# Patient Record
Sex: Female | Born: 1950 | Race: White | Hispanic: No | Marital: Married | Smoking: Former smoker
Health system: Southern US, Community
[De-identification: ages and names within clinical notes are randomized; demographics above are authoritative.]

## PROBLEM LIST (undated history)

## (undated) DIAGNOSIS — E039 Hypothyroidism, unspecified: Secondary | ICD-10-CM

## (undated) DIAGNOSIS — N390 Urinary tract infection, site not specified: Secondary | ICD-10-CM

## (undated) DIAGNOSIS — M1711 Unilateral primary osteoarthritis, right knee: Secondary | ICD-10-CM

## (undated) DIAGNOSIS — K219 Gastro-esophageal reflux disease without esophagitis: Secondary | ICD-10-CM

## (undated) DIAGNOSIS — R198 Other specified symptoms and signs involving the digestive system and abdomen: Secondary | ICD-10-CM

## (undated) DIAGNOSIS — R1310 Dysphagia, unspecified: Secondary | ICD-10-CM

## (undated) DIAGNOSIS — T4145XA Adverse effect of unspecified anesthetic, initial encounter: Secondary | ICD-10-CM

## (undated) DIAGNOSIS — M1990 Unspecified osteoarthritis, unspecified site: Secondary | ICD-10-CM

## (undated) DIAGNOSIS — S92302K Fracture of unspecified metatarsal bone(s), left foot, subsequent encounter for fracture with nonunion: Secondary | ICD-10-CM

## (undated) DIAGNOSIS — Z972 Presence of dental prosthetic device (complete) (partial): Secondary | ICD-10-CM

## (undated) DIAGNOSIS — K08109 Complete loss of teeth, unspecified cause, unspecified class: Secondary | ICD-10-CM

## (undated) DIAGNOSIS — E7800 Pure hypercholesterolemia, unspecified: Secondary | ICD-10-CM

## (undated) DIAGNOSIS — Z8719 Personal history of other diseases of the digestive system: Secondary | ICD-10-CM

## (undated) DIAGNOSIS — T8859XA Other complications of anesthesia, initial encounter: Secondary | ICD-10-CM

## (undated) HISTORY — PX: IR ESOPHAGUS DILITATION RETRO FLUORO: IMG5574

## (undated) HISTORY — PX: ABDOMINAL HYSTERECTOMY: SHX81

## (undated) HISTORY — PX: APPENDECTOMY: SHX54

---

## 2003-12-09 HISTORY — PX: CHOLECYSTECTOMY: SHX55

## 2013-05-27 ENCOUNTER — Encounter (HOSPITAL_BASED_OUTPATIENT_CLINIC_OR_DEPARTMENT_OTHER): Payer: Self-pay | Admitting: *Deleted

## 2013-05-27 ENCOUNTER — Emergency Department (HOSPITAL_BASED_OUTPATIENT_CLINIC_OR_DEPARTMENT_OTHER): Payer: Worker's Compensation

## 2013-05-27 ENCOUNTER — Emergency Department (HOSPITAL_BASED_OUTPATIENT_CLINIC_OR_DEPARTMENT_OTHER)
Admission: EM | Admit: 2013-05-27 | Discharge: 2013-05-27 | Disposition: A | Discharge status: Home / Self Care | Payer: Worker's Compensation | Attending: Emergency Medicine | Admitting: Emergency Medicine

## 2013-05-27 DIAGNOSIS — X500XXA Overexertion from strenuous movement or load, initial encounter: Secondary | ICD-10-CM | POA: Insufficient documentation

## 2013-05-27 DIAGNOSIS — S92355A Nondisplaced fracture of fifth metatarsal bone, left foot, initial encounter for closed fracture: Secondary | ICD-10-CM

## 2013-05-27 DIAGNOSIS — E7800 Pure hypercholesterolemia, unspecified: Secondary | ICD-10-CM | POA: Insufficient documentation

## 2013-05-27 DIAGNOSIS — E079 Disorder of thyroid, unspecified: Secondary | ICD-10-CM | POA: Insufficient documentation

## 2013-05-27 DIAGNOSIS — Y9389 Activity, other specified: Secondary | ICD-10-CM | POA: Insufficient documentation

## 2013-05-27 DIAGNOSIS — Y9289 Other specified places as the place of occurrence of the external cause: Secondary | ICD-10-CM | POA: Insufficient documentation

## 2013-05-27 DIAGNOSIS — S92309A Fracture of unspecified metatarsal bone(s), unspecified foot, initial encounter for closed fracture: Secondary | ICD-10-CM | POA: Insufficient documentation

## 2013-05-27 DIAGNOSIS — Z79899 Other long term (current) drug therapy: Secondary | ICD-10-CM | POA: Insufficient documentation

## 2013-05-27 HISTORY — DX: Pure hypercholesterolemia, unspecified: E78.00

## 2013-05-27 MED ORDER — HYDROCODONE-ACETAMINOPHEN 5-325 MG PO TABS: 2.0000 | ORAL_TABLET | ORAL | Status: DC | PRN

## 2013-05-27 MED ORDER — HYDROCODONE-ACETAMINOPHEN 5-325 MG PO TABS
1.0000 | ORAL_TABLET | Freq: Once | ORAL | Status: DC
  Filled 2013-05-27: qty 1

## 2013-05-27 NOTE — ED Notes (Signed)
Twisted her left foot. Abrasion to her right knee.

## 2013-05-27 NOTE — ED Provider Notes (Signed)
History     CSN: [id number]  Arrival date & time 05/27/13  1417   First MD Initiated Contact with Patient 05/27/13 1528      Chief Complaint  Patient presents with  . Foot Injury    (Consider location/radiation/quality/duration/timing/severity/associated sxs/prior treatment) Patient is a 62 y.o. female presenting with foot injury. The history is provided by the patient. No language interpreter was used.  Foot Injury Location:  Foot Injury: yes   Foot location:  L foot Pain details:    Quality:  Aching and burning   Severity:  Moderate   Duration:  2 hours   Timing:  Constant   Progression:  Worsening Chronicity:  New Worsened by:  Nothing tried Pt reports she turned her foot over.   Pt complains of swelling and pain  Past Medical History  Diagnosis Date  . Thyroid disease   . High cholesterol     Past Surgical History  Procedure Laterality Date  . Cholecystectomy    . Abdominal hysterectomy      No family history on file.  History  Substance Use Topics  . Smoking status: Never Smoker   . Smokeless tobacco: Not on file  . Alcohol Use: No    OB History   Grav Para Term Preterm Abortions TAB SAB Ect Mult Living                  Review of Systems  Musculoskeletal: Positive for myalgias and joint swelling.  All other systems reviewed and are negative.    Allergies  Darvocet  Home Medications   Current Outpatient Rx  Name  Route  Sig  Dispense  Refill  . levothyroxine (SYNTHROID) 75 MCG tablet   Oral   Take 75 mcg by mouth daily before breakfast.         . simvastatin (ZOCOR) 10 MG tablet   Oral   Take 10 mg by mouth at bedtime.           BP 140/64  Pulse 73  Temp(Src) 98.4 F (36.9 C) (Oral)  Resp 20  Wt 200 lb (90.719 kg)  SpO2 93%  Physical Exam  Nursing note and vitals reviewed. Constitutional: She is oriented to person, place, and time. She appears well-developed and well-nourished.  Musculoskeletal: She exhibits edema and  tenderness.  Swollen tender lateral left foot  Decreased range of motion  Neurological: She is alert and oriented to person, place, and time. She has normal reflexes.  Skin: Skin is warm.  Psychiatric: She has a normal mood and affect.    ED Course  Procedures (including critical care time)  Labs Reviewed - No data to display Dg Foot Complete Left  05/27/2013   *RADIOLOGY REPORT*  Clinical Data: And lateral pain.  Injury.  LEFT FOOT - COMPLETE 3+ VIEW  Comparison: None  Findings: There is a fracture through the base of the left fifth metatarsal.  Minimal displacement.  Slight overlying soft tissue swelling.  No additional acute bony abnormality.  Mild hallux valgus.  IMPRESSION: Minimally-displaced fracture through the base of the left fifth metatarsal.   Original Report Authenticated By: Charlett Nose, M.D.     1. Closed nondisplaced fracture of fifth left metatarsal bone, initial encounter       MDM  Cam walker, crutches,  Hydrococone,   Follow up with Dr. Lajoyce Corners for evaluation next week        Elson Areas, PA-C 05/27/13 1603  Lonia Skinner [place], PA-C 05/27/13 1605

## 2013-05-27 NOTE — ED Provider Notes (Signed)
Medical screening examination/treatment/procedure(s) were performed by non-physician practitioner and as supervising physician I was immediately available for consultation/collaboration.   Susy Placzek, MD 05/27/13 1847 

## 2013-05-27 NOTE — ED Notes (Signed)
Lauren, EMT at bedside for cam walker and crutches, will monitor.

## 2013-06-08 ENCOUNTER — Ambulatory Visit (INDEPENDENT_AMBULATORY_CARE_PROVIDER_SITE_OTHER): Payer: Worker's Compensation

## 2013-06-08 ENCOUNTER — Other Ambulatory Visit: Payer: Self-pay | Admitting: Sports Medicine

## 2013-06-08 DIAGNOSIS — IMO0001 Reserved for inherently not codable concepts without codable children: Secondary | ICD-10-CM

## 2013-06-08 DIAGNOSIS — M79672 Pain in left foot: Secondary | ICD-10-CM

## 2014-02-05 DIAGNOSIS — S92302K Fracture of unspecified metatarsal bone(s), left foot, subsequent encounter for fracture with nonunion: Secondary | ICD-10-CM

## 2014-02-05 HISTORY — DX: Fracture of unspecified metatarsal bone(s), left foot, subsequent encounter for fracture with nonunion: S92.302K

## 2014-02-17 ENCOUNTER — Encounter (HOSPITAL_BASED_OUTPATIENT_CLINIC_OR_DEPARTMENT_OTHER): Payer: Self-pay | Admitting: *Deleted

## 2014-02-22 ENCOUNTER — Other Ambulatory Visit: Payer: Self-pay | Admitting: Orthopedic Surgery

## 2014-02-23 ENCOUNTER — Encounter (HOSPITAL_BASED_OUTPATIENT_CLINIC_OR_DEPARTMENT_OTHER)
Admission: RE | Disposition: A | Discharge status: Home / Self Care | Payer: Self-pay | Source: Ambulatory Visit | Attending: Orthopedic Surgery

## 2014-02-23 ENCOUNTER — Ambulatory Visit (HOSPITAL_BASED_OUTPATIENT_CLINIC_OR_DEPARTMENT_OTHER): Payer: Worker's Compensation | Admitting: Certified Registered"

## 2014-02-23 ENCOUNTER — Ambulatory Visit (HOSPITAL_BASED_OUTPATIENT_CLINIC_OR_DEPARTMENT_OTHER)
Admission: RE | Admit: 2014-02-23 | Discharge: 2014-02-23 | Disposition: A | Discharge status: Home / Self Care | Payer: Worker's Compensation | Source: Ambulatory Visit | Attending: Orthopedic Surgery | Admitting: Orthopedic Surgery

## 2014-02-23 ENCOUNTER — Encounter (HOSPITAL_BASED_OUTPATIENT_CLINIC_OR_DEPARTMENT_OTHER): Payer: Worker's Compensation | Admitting: Certified Registered"

## 2014-02-23 ENCOUNTER — Encounter (HOSPITAL_BASED_OUTPATIENT_CLINIC_OR_DEPARTMENT_OTHER): Payer: Self-pay | Admitting: Certified Registered"

## 2014-02-23 DIAGNOSIS — S8290XS Unspecified fracture of unspecified lower leg, sequela: Secondary | ICD-10-CM | POA: Insufficient documentation

## 2014-02-23 DIAGNOSIS — X58XXXS Exposure to other specified factors, sequela: Secondary | ICD-10-CM | POA: Insufficient documentation

## 2014-02-23 DIAGNOSIS — IMO0002 Reserved for concepts with insufficient information to code with codable children: Secondary | ICD-10-CM | POA: Insufficient documentation

## 2014-02-23 DIAGNOSIS — E039 Hypothyroidism, unspecified: Secondary | ICD-10-CM | POA: Insufficient documentation

## 2014-02-23 DIAGNOSIS — Z87891 Personal history of nicotine dependence: Secondary | ICD-10-CM | POA: Insufficient documentation

## 2014-02-23 DIAGNOSIS — E7800 Pure hypercholesterolemia, unspecified: Secondary | ICD-10-CM | POA: Insufficient documentation

## 2014-02-23 HISTORY — DX: Hypothyroidism, unspecified: E03.9

## 2014-02-23 HISTORY — DX: Fracture of unspecified metatarsal bone(s), left foot, subsequent encounter for fracture with nonunion: S92.302K

## 2014-02-23 HISTORY — DX: Dysphagia, unspecified: R13.10

## 2014-02-23 HISTORY — DX: Adverse effect of unspecified anesthetic, initial encounter: T41.45XA

## 2014-02-23 HISTORY — DX: Other specified symptoms and signs involving the digestive system and abdomen: R19.8

## 2014-02-23 HISTORY — DX: Other complications of anesthesia, initial encounter: T88.59XA

## 2014-02-23 HISTORY — DX: Complete loss of teeth, unspecified cause, unspecified class: K08.109

## 2014-02-23 HISTORY — DX: Presence of dental prosthetic device (complete) (partial): Z97.2

## 2014-02-23 HISTORY — PX: ANKLE RECONSTRUCTION: SHX1151

## 2014-02-23 LAB — POCT HEMOGLOBIN-HEMACUE: HEMOGLOBIN: 15 g/dL (ref 12.0–15.0)

## 2014-02-23 SURGERY — RECONSTRUCTION, ANKLE
Anesthesia: General | Site: Foot | Laterality: Left

## 2014-02-23 MED ORDER — MIDAZOLAM HCL 2 MG/2ML IJ SOLN
INTRAMUSCULAR | Status: AC
  Filled 2014-02-23: qty 2

## 2014-02-23 MED ORDER — CEFAZOLIN SODIUM-DEXTROSE 2-3 GM-% IV SOLR
2.0000 g | INTRAVENOUS | Status: AC
  Administered 2014-02-23: 2 g via INTRAVENOUS

## 2014-02-23 MED ORDER — MIDAZOLAM HCL 2 MG/2ML IJ SOLN
1.0000 mg | INTRAMUSCULAR | Status: DC | PRN
  Administered 2014-02-23: 2 mg via INTRAVENOUS

## 2014-02-23 MED ORDER — PROPOFOL 10 MG/ML IV BOLUS
INTRAVENOUS | Status: DC | PRN
  Administered 2014-02-23: 150 mg via INTRAVENOUS

## 2014-02-23 MED ORDER — MIDAZOLAM HCL 2 MG/2ML IJ SOLN: 1.0000 mg | INTRAMUSCULAR | Status: DC | PRN

## 2014-02-23 MED ORDER — 0.9 % SODIUM CHLORIDE (POUR BTL) OPTIME
TOPICAL | Status: DC | PRN
  Administered 2014-02-23: 200 mL

## 2014-02-23 MED ORDER — DEXAMETHASONE SODIUM PHOSPHATE 10 MG/ML IJ SOLN
INTRAMUSCULAR | Status: DC | PRN
  Administered 2014-02-23: 10 mg via INTRAVENOUS

## 2014-02-23 MED ORDER — ASPIRIN EC 325 MG PO TBEC: 325.0000 mg | DELAYED_RELEASE_TABLET | Freq: Every day | ORAL | Status: DC

## 2014-02-23 MED ORDER — CEFAZOLIN SODIUM-DEXTROSE 2-3 GM-% IV SOLR
INTRAVENOUS | Status: AC
  Filled 2014-02-23: qty 50

## 2014-02-23 MED ORDER — BACITRACIN ZINC 500 UNIT/GM EX OINT
TOPICAL_OINTMENT | CUTANEOUS | Status: DC | PRN
  Administered 2014-02-23: 1 via TOPICAL

## 2014-02-23 MED ORDER — SODIUM CHLORIDE 0.9 % IV SOLN: INTRAVENOUS | Status: DC

## 2014-02-23 MED ORDER — FENTANYL CITRATE 0.05 MG/ML IJ SOLN: 50.0000 ug | INTRAMUSCULAR | Status: DC | PRN

## 2014-02-23 MED ORDER — DEXAMETHASONE SODIUM PHOSPHATE 10 MG/ML IJ SOLN
INTRAMUSCULAR | Status: DC | PRN
  Administered 2014-02-23: 4 mg

## 2014-02-23 MED ORDER — FENTANYL CITRATE 0.05 MG/ML IJ SOLN
50.0000 ug | Freq: Once | INTRAMUSCULAR | Status: AC
  Administered 2014-02-23: 100 ug via INTRAVENOUS

## 2014-02-23 MED ORDER — FENTANYL CITRATE 0.05 MG/ML IJ SOLN
INTRAMUSCULAR | Status: AC
Start: 2014-02-23 — End: 2014-02-23
  Filled 2014-02-23: qty 2

## 2014-02-23 MED ORDER — OXYCODONE HCL 5 MG PO TABS: 5.0000 mg | ORAL_TABLET | ORAL | Status: DC | PRN

## 2014-02-23 MED ORDER — CHLORHEXIDINE GLUCONATE 4 % EX LIQD: 60.0000 mL | Freq: Once | CUTANEOUS | Status: DC

## 2014-02-23 MED ORDER — BACITRACIN ZINC 500 UNIT/GM EX OINT
TOPICAL_OINTMENT | CUTANEOUS | Status: AC
  Filled 2014-02-23: qty 28.35

## 2014-02-23 MED ORDER — FENTANYL CITRATE 0.05 MG/ML IJ SOLN
INTRAMUSCULAR | Status: AC
  Filled 2014-02-23: qty 6

## 2014-02-23 MED ORDER — BUPIVACAINE-EPINEPHRINE PF 0.5-1:200000 % IJ SOLN
INTRAMUSCULAR | Status: AC
  Filled 2014-02-23: qty 30

## 2014-02-23 MED ORDER — ONDANSETRON HCL 4 MG/2ML IJ SOLN
INTRAMUSCULAR | Status: DC | PRN
  Administered 2014-02-23: 4 mg via INTRAVENOUS

## 2014-02-23 MED ORDER — BUPIVACAINE-EPINEPHRINE PF 0.5-1:200000 % IJ SOLN
INTRAMUSCULAR | Status: DC | PRN
  Administered 2014-02-23: 35 mL

## 2014-02-23 MED ORDER — LACTATED RINGERS IV SOLN
INTRAVENOUS | Status: DC
  Administered 2014-02-23 (×2): via INTRAVENOUS

## 2014-02-23 MED ORDER — MIDAZOLAM HCL 2 MG/ML PO SYRP
12.0000 mg | ORAL_SOLUTION | Freq: Once | ORAL | Status: DC | PRN
Start: 2014-02-23 — End: 2014-02-23

## 2014-02-23 MED ORDER — MIDAZOLAM HCL 5 MG/5ML IJ SOLN
INTRAMUSCULAR | Status: DC | PRN
  Administered 2014-02-23: 2 mg via INTRAVENOUS

## 2014-02-23 MED ORDER — LIDOCAINE HCL (CARDIAC) 20 MG/ML IV SOLN
INTRAVENOUS | Status: DC | PRN
  Administered 2014-02-23: 30 mg via INTRAVENOUS

## 2014-02-23 SURGICAL SUPPLY — 78 items
ANCHOR SUT 1.45 SZ 1 SHORT (Anchor) ×3 IMPLANT
BANDAGE ESMARK 6X9 LF (GAUZE/BANDAGES/DRESSINGS) ×1 IMPLANT
BLADE AVERAGE 25MMX9MM (BLADE) ×1
BLADE AVERAGE 25X9 (BLADE) ×2 IMPLANT
BLADE SURG 15 STRL LF DISP TIS (BLADE) ×2 IMPLANT
BLADE SURG 15 STRL SS (BLADE) ×4
BNDG COHESIVE 4X5 TAN STRL (GAUZE/BANDAGES/DRESSINGS) ×3 IMPLANT
BNDG COHESIVE 6X5 TAN STRL LF (GAUZE/BANDAGES/DRESSINGS) ×3 IMPLANT
BNDG ESMARK 4X9 LF (GAUZE/BANDAGES/DRESSINGS) IMPLANT
BNDG ESMARK 6X9 LF (GAUZE/BANDAGES/DRESSINGS) ×3
CHLORAPREP W/TINT 26ML (MISCELLANEOUS) ×3 IMPLANT
COVER TABLE BACK 60X90 (DRAPES) ×3 IMPLANT
CUFF TOURNIQUET SINGLE 18IN (TOURNIQUET CUFF) IMPLANT
CUFF TOURNIQUET SINGLE 34IN LL (TOURNIQUET CUFF) ×3 IMPLANT
DECANTER SPIKE VIAL GLASS SM (MISCELLANEOUS) IMPLANT
DRAPE C-ARM 42X72 X-RAY (DRAPES) IMPLANT
DRAPE C-ARMOR (DRAPES) IMPLANT
DRAPE EXTREMITY T 121X128X90 (DRAPE) ×3 IMPLANT
DRAPE OEC MINIVIEW 54X84 (DRAPES) IMPLANT
DRAPE U-SHAPE 47X51 STRL (DRAPES) IMPLANT
DRSG EMULSION OIL 3X3 NADH (GAUZE/BANDAGES/DRESSINGS) ×3 IMPLANT
DRSG PAD ABDOMINAL 8X10 ST (GAUZE/BANDAGES/DRESSINGS) ×9 IMPLANT
ELECT REM PT RETURN 9FT ADLT (ELECTROSURGICAL) ×3
ELECTRODE REM PT RTRN 9FT ADLT (ELECTROSURGICAL) ×1 IMPLANT
GLOVE BIO SURGEON STRL SZ8 (GLOVE) ×3 IMPLANT
GLOVE BIOGEL PI IND STRL 7.0 (GLOVE) ×1 IMPLANT
GLOVE BIOGEL PI IND STRL 7.5 (GLOVE) ×1 IMPLANT
GLOVE BIOGEL PI IND STRL 8 (GLOVE) ×1 IMPLANT
GLOVE BIOGEL PI INDICATOR 7.0 (GLOVE) ×2
GLOVE BIOGEL PI INDICATOR 7.5 (GLOVE) ×2
GLOVE BIOGEL PI INDICATOR 8 (GLOVE) ×2
GLOVE ECLIPSE 6.5 STRL STRAW (GLOVE) ×3 IMPLANT
GLOVE ECLIPSE 7.0 STRL STRAW (GLOVE) ×3 IMPLANT
GLOVE EXAM NITRILE MD LF STRL (GLOVE) IMPLANT
GOWN STRL REUS W/ TWL LRG LVL3 (GOWN DISPOSABLE) ×2 IMPLANT
GOWN STRL REUS W/ TWL XL LVL3 (GOWN DISPOSABLE) ×1 IMPLANT
GOWN STRL REUS W/TWL LRG LVL3 (GOWN DISPOSABLE) ×4
GOWN STRL REUS W/TWL XL LVL3 (GOWN DISPOSABLE) ×2
K-WIRE .062X4 (WIRE) IMPLANT
NEEDLE HYPO 22GX1.5 SAFETY (NEEDLE) IMPLANT
NS IRRIG 1000ML POUR BTL (IV SOLUTION) ×3 IMPLANT
PACK BASIN DAY SURGERY FS (CUSTOM PROCEDURE TRAY) ×3 IMPLANT
PAD CAST 4YDX4 CTTN HI CHSV (CAST SUPPLIES) ×1 IMPLANT
PADDING CAST ABS 4INX4YD NS (CAST SUPPLIES)
PADDING CAST ABS COTTON 4X4 ST (CAST SUPPLIES) IMPLANT
PADDING CAST COTTON 4X4 STRL (CAST SUPPLIES) ×2
PADDING CAST COTTON 6X4 STRL (CAST SUPPLIES) ×3 IMPLANT
PASSER SUT SWANSON 36MM LOOP (INSTRUMENTS) IMPLANT
PENCIL BUTTON HOLSTER BLD 10FT (ELECTRODE) ×3 IMPLANT
SANITIZER HAND PURELL 535ML FO (MISCELLANEOUS) ×3 IMPLANT
SHEET MEDIUM DRAPE 40X70 STRL (DRAPES) ×3 IMPLANT
SLEEVE SCD COMPRESS KNEE MED (MISCELLANEOUS) ×3 IMPLANT
SPLINT FAST PLASTER 5X30 (CAST SUPPLIES) ×40
SPLINT PLASTER CAST FAST 5X30 (CAST SUPPLIES) ×20 IMPLANT
SPONGE GAUZE 4X4 12PLY (GAUZE/BANDAGES/DRESSINGS) ×3 IMPLANT
SPONGE LAP 18X18 X RAY DECT (DISPOSABLE) ×3 IMPLANT
STOCKINETTE 6  STRL (DRAPES) ×2
STOCKINETTE 6 STRL (DRAPES) ×1 IMPLANT
SUCTION FRAZIER TIP 10 FR DISP (SUCTIONS) IMPLANT
SUT ETHIBOND 0 MO6 C/R (SUTURE) IMPLANT
SUT ETHIBOND 2 OS 4 DA (SUTURE) IMPLANT
SUT ETHILON 3 0 PS 1 (SUTURE) ×3 IMPLANT
SUT FIBERWIRE 2-0 18 17.9 3/8 (SUTURE)
SUT MERSILENE 2.0 SH NDLE (SUTURE) IMPLANT
SUT MNCRL AB 3-0 PS2 18 (SUTURE) ×3 IMPLANT
SUT MNCRL AB 4-0 PS2 18 (SUTURE) IMPLANT
SUT VIC AB 0 SH 27 (SUTURE) IMPLANT
SUT VIC AB 2-0 SH 18 (SUTURE) IMPLANT
SUT VIC AB 2-0 SH 27 (SUTURE)
SUT VIC AB 2-0 SH 27XBRD (SUTURE) IMPLANT
SUT VICRYL 4-0 PS2 18IN ABS (SUTURE) IMPLANT
SUTURE FIBERWR 2-0 18 17.9 3/8 (SUTURE) IMPLANT
SYR BULB 3OZ (MISCELLANEOUS) ×3 IMPLANT
TOWEL OR 17X24 6PK STRL BLUE (TOWEL DISPOSABLE) ×6 IMPLANT
TOWEL OR NON WOVEN STRL DISP B (DISPOSABLE) IMPLANT
TUBE CONNECTING 20'X1/4 (TUBING)
TUBE CONNECTING 20X1/4 (TUBING) IMPLANT
UNDERPAD 30X30 INCONTINENT (UNDERPADS AND DIAPERS) ×3 IMPLANT

## 2014-02-23 NOTE — Anesthesia Procedure Notes (Signed)
Anesthesia Regional Block:  Popliteal block  Pre-Anesthetic Checklist: ,, timeout performed, Correct Patient, Correct Site, Correct Laterality, Correct Procedure, Correct Position, site marked, Risks and benefits discussed,  Surgical consent,  Pre-op evaluation,  At surgeon's request and post-op pain management  Laterality: Left  Prep: chloraprep       Needles:  Injection technique: Single-shot  Needle Type: Echogenic Stimulator Needle     Needle Length: 9cm 9 cm Needle Gauge: 22 and 22 G    Additional Needles:  Procedures: nerve stimulator Popliteal block  Nerve Stimulator or Paresthesia:  Response: 0.5 mA,   Additional Responses:   Narrative:  Start time: 02/23/2014 9:20 AM End time: 02/23/2014 9:33 AM Injection made incrementally with aspirations every 5 mL. Anesthesiologist: Natalie Sanders  Additional Notes: 1610-9604: 0920-0933 L Popliteal Nerve Block POP CHG prep, sterile tech Lateral approach #22 stim/echo needle with stim down to .5ma Multiple neg asp Natalie Sanders .5% w/epi 1:200000 total 35cc+decadron 4mg  infiltrated No compl Natalie Sanders

## 2014-02-23 NOTE — Anesthesia Preprocedure Evaluation (Addendum)
Anesthesia Evaluation  Patient identified by MRN, date of birth, ID band Patient awake    Reviewed: Allergy & Precautions, H&P , NPO status , Patient's Chart, lab work & pertinent test results  Airway Mallampati: II TM Distance: >3 FB Neck ROM: Full    Dental   Pulmonary former smoker,  breath sounds clear to auscultation        Cardiovascular Rhythm:Regular Rate:Normal     Neuro/Psych    GI/Hepatic   Endo/Other  Hypothyroidism   Renal/GU      Musculoskeletal   Abdominal (+) + obese,   Peds  Hematology   Anesthesia Other Findings   Reproductive/Obstetrics                          Anesthesia Physical Anesthesia Plan  ASA: II  Anesthesia Plan: General   Post-op Pain Management:    Induction: Intravenous  Airway Management Planned: LMA  Additional Equipment:   Intra-op Plan:   Post-operative Plan: Extubation in OR  Informed Consent: I have reviewed the patients History and Physical, chart, labs and discussed the procedure including the risks, benefits and alternatives for the proposed anesthesia with the patient or authorized representative who has indicated his/her understanding and acceptance.     Plan Discussed with: CRNA and Surgeon  Anesthesia Plan Comments:         Anesthesia Quick Evaluation

## 2014-02-23 NOTE — Anesthesia Postprocedure Evaluation (Signed)
  Anesthesia Post-op Note  Patient: Natalie Sanders  Procedure(s) Performed: Procedure(s): LEFT FIFTH BASE EXOSTOSIS/PERONEUS BREVIS REPAIR (Left)  Patient Location: PACU  Anesthesia Type:GA combined with regional for post-op pain  Level of Consciousness: awake and alert   Airway and Oxygen Therapy: Patient Spontanous Breathing  Post-op Pain: none  Post-op Assessment: Post-op Vital signs reviewed, Patient's Cardiovascular Status Stable, Respiratory Function Stable, Patent Airway, No signs of Nausea or vomiting and Pain level controlled  Post-op Vital Signs: Reviewed and stable  Complications: No apparent anesthesia complications

## 2014-02-23 NOTE — Progress Notes (Signed)
Assisted Dr. Gypsy BalsamKasik with left, popliteal block. Side rails up, monitors on throughout procedure. See vital signs in flow sheet. Tolerated Procedure well.

## 2014-02-23 NOTE — Brief Op Note (Signed)
02/23/2014  11:28 AM  PATIENT:  Natalie Sanders  63 y.o. female  PRE-OPERATIVE DIAGNOSIS:  LEFT FIFTH METATARSAL BASE NON UNION  POST-OPERATIVE DIAGNOSIS:  LEFT FIFTH METATARSAL BASE NON UNION  Procedure(s): 1.  Excision of left 5th MT base nonunion 2.  Reconstruction of left peroneus brevis tendon  SURGEON:  Toni ArthursJohn Katalyn Matin, MD  ASSISTANT: n/a  ANESTHESIA:   General, regional  EBL:  minimal   TOURNIQUET:   Total Tourniquet Time Documented: Thigh (Left) - 23 minutes Total: Thigh (Left) - 23 minutes   COMPLICATIONS:  None apparent  DISPOSITION:  Extubated, awake and stable to recovery.  DICTATION ID:  [id number]

## 2014-02-23 NOTE — Op Note (Signed)
NAME:  Natalie Sanders, [name]              ACCOUNT NO.:  [id number]  MEDICAL RECORD NO.:  [id number]  LOCATION:                                 FACILITY:  PHYSICIAN:  Toni ArthursJohn Brady Schiller, MD             DATE OF BIRTH:  DATE OF PROCEDURE:  02/23/2014 DATE OF DISCHARGE:                              OPERATIVE REPORT   PREOPERATIVE DIAGNOSIS:  Left fifth metatarsal base nonunion.  POSTOPERATIVE DIAGNOSIS:  Left fifth metatarsal base nonunion.  PROCEDURE: 1. Excision of left fifth metatarsal, right base nonunion. 2. Reconstruction of left peroneus brevis tendon.  SURGEON:  Toni ArthursJohn Nare Gaspari, MD  ANESTHESIA:  General, regional.  ESTIMATED BLOOD LOSS:  Minimal.  TOURNIQUET TIME:  23 minutes at 220 mmHg.  COMPLICATIONS:  None apparent.  DISPOSITION:  Extubated, awake, and stable to recovery.  INDICATION FOR PROCEDURE:  The patient is a 63 year old woman with past medical history significant for a left fifth metatarsal base fracture that occurred at work over a year ago.  She has developed a painful nonunion at fifth metatarsal base.  She presents now for excision of the bone fragment and reconstruction of the peroneus brevis.  She understands the risks and benefits, the alternative treatment options and elects surgical treatment.  She specifically understands risks of bleeding, infection, nerve damage, blood clots, need for additional surgery, continued pain, amputation and death.  PROCEDURE IN DETAIL:  After preoperative consent was obtained and the correct operative site was identified, the patient was brought to the operating room and placed supine on the operating table.  General anesthesia was induced.  Preoperative antibiotics were administered. Surgical time-out was taken.  Left lower extremity was prepped and draped in standard sterile fashion with tourniquet around the thigh. The extremity was exsanguinated and tourniquet was inflated to 220 mmHg. A longitudinal incision was then made  over the base of the fifth metatarsal.  Sharp dissection was carried down through skin and subcutaneous tissue.  The peroneus brevis tendon was identified.  It was in generally good condition and appeared healthy.  The nonunited fragment of bone at the fifth metatarsal base was identified.  The nonunion site was cleared of all fibrous tissue.  The fragment was removed with a rongeur.  The remaining bone at the base of the fifth metatarsal was healthy appearing.  A suture anchor from [employer] was inserted into the bone and was noted to have excellent purchase.  The anchor was then used to reconstruct the peroneal tendon insertion back to the cut surface of bone.  The wound was irrigated copiously. Subcutaneous tissue was repaired over the base of the fifth metatarsal with inverted simple sutures of 3-0 Monocryl and a running 3-0 nylon was used to close the skin incision.  Sterile dressings were applied followed by a well-padded short-leg splint.  Tourniquet was released at 23 minutes after application of the dressings.  The patient was then awakened from anesthesia and transported to the recovery room in stable condition.  FOLLOWUP PLAN:  The patient will be nonweightbearing as tolerated on the left lower extremity.  She will follow up with me in 2 weeks for suture removal and conversion to  a CAM walker boot and to initiate range of motion in dorsiflexion and plantar flexion.     Toni Arthurs, MD     JH/MEDQ  D:  02/23/2014  T:  02/23/2014  Job:  [id number]

## 2014-02-23 NOTE — Discharge Instructions (Signed)
John Hewitt, MD °[facility] ° °Please read the following information regarding your care after surgery. ° °Medications  °You only need a prescription for the narcotic pain medicine (ex. oxycodone, Percocet, Norco).  All of the other medicines listed below are available over the counter. °X acetominophen (Tylenol) 650 mg every 4-6 hours as you need for minor pain °X oxycodone as prescribed for moderate to severe pain ° °Narcotic pain medicine (ex. oxycodone, Percocet, Vicodin) will cause constipation.  To prevent this problem, take the following medicines while you are taking any pain medicine. °X docusate sodium (Colace) 100 mg twice a day X senna (Senokot) 2 tablets twice a day ° °X To help prevent blood clots, take an aspirin (325 mg) once a day for a month after surgery.  You should also get up every hour while you are awake to move around.   ° °Weight Bearing °? Bear weight when you are able on your operated leg or foot. °? Bear weight only on the heel of your operated foot in the post-op shoe. °X Do not bear any weight on the operated leg or foot. ° °Cast / Splint / Dressing °X Keep your splint or cast clean and dry.  Don’t put anything (coat hanger, pencil, etc) down inside of it.  If it gets damp, use a hair dryer on the cool setting to dry it.  If it gets soaked, call the office to schedule an appointment for a cast change. °? Remove your dressing 3 days after surgery and cover the incisions with dry dressings.   ° °After your dressing, cast or splint is removed; you may shower, but do not soak or scrub the wound.  Allow the water to run over it, and then gently pat it dry. ° °Swelling °It is normal for you to have swelling where you had surgery.  To reduce swelling and pain, keep your toes above your nose for at least 3 days after surgery.  It may be necessary to keep your foot or leg elevated for several weeks.  If it hurts, it should be elevated. ° °Follow Up °Call my office at [phone]  when you are discharged from the hospital or surgery center to schedule an appointment to be seen two weeks after surgery. ° °Call my office at [phone] if you develop a fever >101.5° F, nausea, vomiting, bleeding from the surgical site or severe pain.   ° ° °Post Anesthesia Home Care Instructions ° °Activity: °Get plenty of rest for the remainder of the day. A responsible adult should stay with you for 24 hours following the procedure.  °For the next 24 hours, DO NOT: °-Drive a car °-Operate machinery °-Drink alcoholic beverages °-Take any medication unless instructed by your physician °-Make any legal decisions or sign important papers. ° °Meals: °Start with liquid foods such as gelatin or soup. Progress to regular foods as tolerated. Avoid greasy, spicy, heavy foods. If nausea and/or vomiting occur, drink only clear liquids until the nausea and/or vomiting subsides. Call your physician if vomiting continues. ° °Special Instructions/Symptoms: °Your throat may feel dry or sore from the anesthesia or the breathing tube placed in your throat during surgery. If this causes discomfort, gargle with warm salt water. The discomfort should disappear within 24 hours. ° ° ° °Regional Anesthesia Blocks ° °1. Numbness or the inability to move the "blocked" extremity may last from 3-48 hours after placement. The length of time depends on the medication injected and your individual response to the medication.   If the numbness is not going away after 48 hours, call your surgeon. ° °2. The extremity that is blocked will need to be protected until the numbness is gone and the  Strength has returned. Because you cannot feel it, you will need to take extra care to avoid injury. Because it may be weak, you may have difficulty moving it or using it. You may not know what position it is in without looking at it while the block is in effect. ° °3. For blocks in the legs and feet, returning to weight bearing and walking needs to be  done carefully. You will need to wait until the numbness is entirely gone and the strength has returned. You should be able to move your leg and foot normally before you try and bear weight or walk. You will need someone to be with you when you first try to ensure you do not fall and possibly risk injury. ° °4. Bruising and tenderness at the needle site are common side effects and will resolve in a few days. ° °5. Persistent numbness or new problems with movement should be communicated to the surgeon or the  Surgery Center ([phone])/ [facility] ([phone]). °

## 2014-02-23 NOTE — H&P (Signed)
Natalie Sanders is an 63 y.o. female.   Chief Complaint: left foot pain HPI: 63 y/o female with left 5th MT base fracture painful nonunion after inversion injury to the ankle.  She presents now for operative treatment after failing nonoperative treatment.  Past Medical History  Diagnosis Date  . High cholesterol   . Complication of anesthesia     hypotension after cholecystectomy  . Hypothyroidism   . 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 Full dentures   . Difficulty swallowing pills     especially large pills    Past Surgical History  Procedure Laterality Date  . Cholecystectomy  2005  . Abdominal hysterectomy      partial    History reviewed. No pertinent family history. Social History:  reports that she quit smoking about 7 years ago. She has never used smokeless tobacco. She reports that she does not drink alcohol or use illicit drugs.  Allergies:  Allergies  Allergen Reactions  . Darvocet [Propoxyphene N-Acetaminophen] Nausea Only  . Darvon [Propoxyphene] Nausea Only    Medications Prior to Admission  Medication Sig Dispense Refill  . levothyroxine (SYNTHROID) 75 MCG tablet Take 50 mcg by mouth daily before breakfast.       . simvastatin (ZOCOR) 10 MG tablet Take 10 mg by mouth at bedtime.      . meloxicam (MOBIC) 7.5 MG tablet Take 7.5 mg by mouth as needed for pain.        No results found for this or any previous visit (from the past 48 hour(s)). No results found.  ROS  No recent f/c/n/v/wt loss.  Blood pressure 107/47, pulse 61, temperature 97.8 F (36.6 C), temperature source Oral, resp. rate 14, height 5\' 4"  (1.626 m), weight 93.951 kg (207 lb 2 oz), SpO2 100.00%. Physical Exam  wn wd woman in nad.  A and O x 4.  Mood and affect noraml.  EOMI.  Resp unlabored.  L foot ttp at insertion of peroneus brevis at the base of the 5th MT.  Skin healthy and intact.  Sens to LT intact.  5/5 strength in PF, DF,  inversion and eversion.  Assessment/Plan L foot 5th mt base nonunion - to OR for excision of fragment and reconstruction of peroneus brevis tendon.  The risks and benefits of the alternative treatment options have been discussed in detail.  The patient wishes to proceed with surgery and specifically understands risks of bleeding, infection, nerve damage, blood clots, need for additional surgery, amputation and death.   HEWITJonny Ruiz, Natalie Sanders 02/23/2014, 10:12 AM

## 2014-02-23 NOTE — Transfer of Care (Signed)
Immediate Anesthesia Transfer of Care Note  Patient: Natalie Sanders  Procedure(s) Performed: Procedure(s): LEFT FIFTH BASE EXOSTOSIS/PERONEUS BREVIS REPAIR (Left)  Patient Location: PACU  Anesthesia Type:GA combined with regional for post-op pain  Level of Consciousness: awake, alert , oriented and patient cooperative  Airway & Oxygen Therapy: Patient Spontanous Breathing and Patient connected to face mask oxygen  Post-op Assessment: Report given to PACU RN and Post -op Vital signs reviewed and stable  Post vital signs: Reviewed and stable  Complications: No apparent anesthesia complications

## 2014-02-24 ENCOUNTER — Encounter (HOSPITAL_BASED_OUTPATIENT_CLINIC_OR_DEPARTMENT_OTHER): Payer: Self-pay | Admitting: Orthopedic Surgery

## 2014-06-30 IMAGING — CR DG FOOT COMPLETE 3+V*L*
3 series · 3 of 3 positions shown · non-contrast
Comparison: None

CLINICAL DATA: And lateral pain.  Injury.

LEFT FOOT - COMPLETE 3+ VIEW

[t foot ap left]
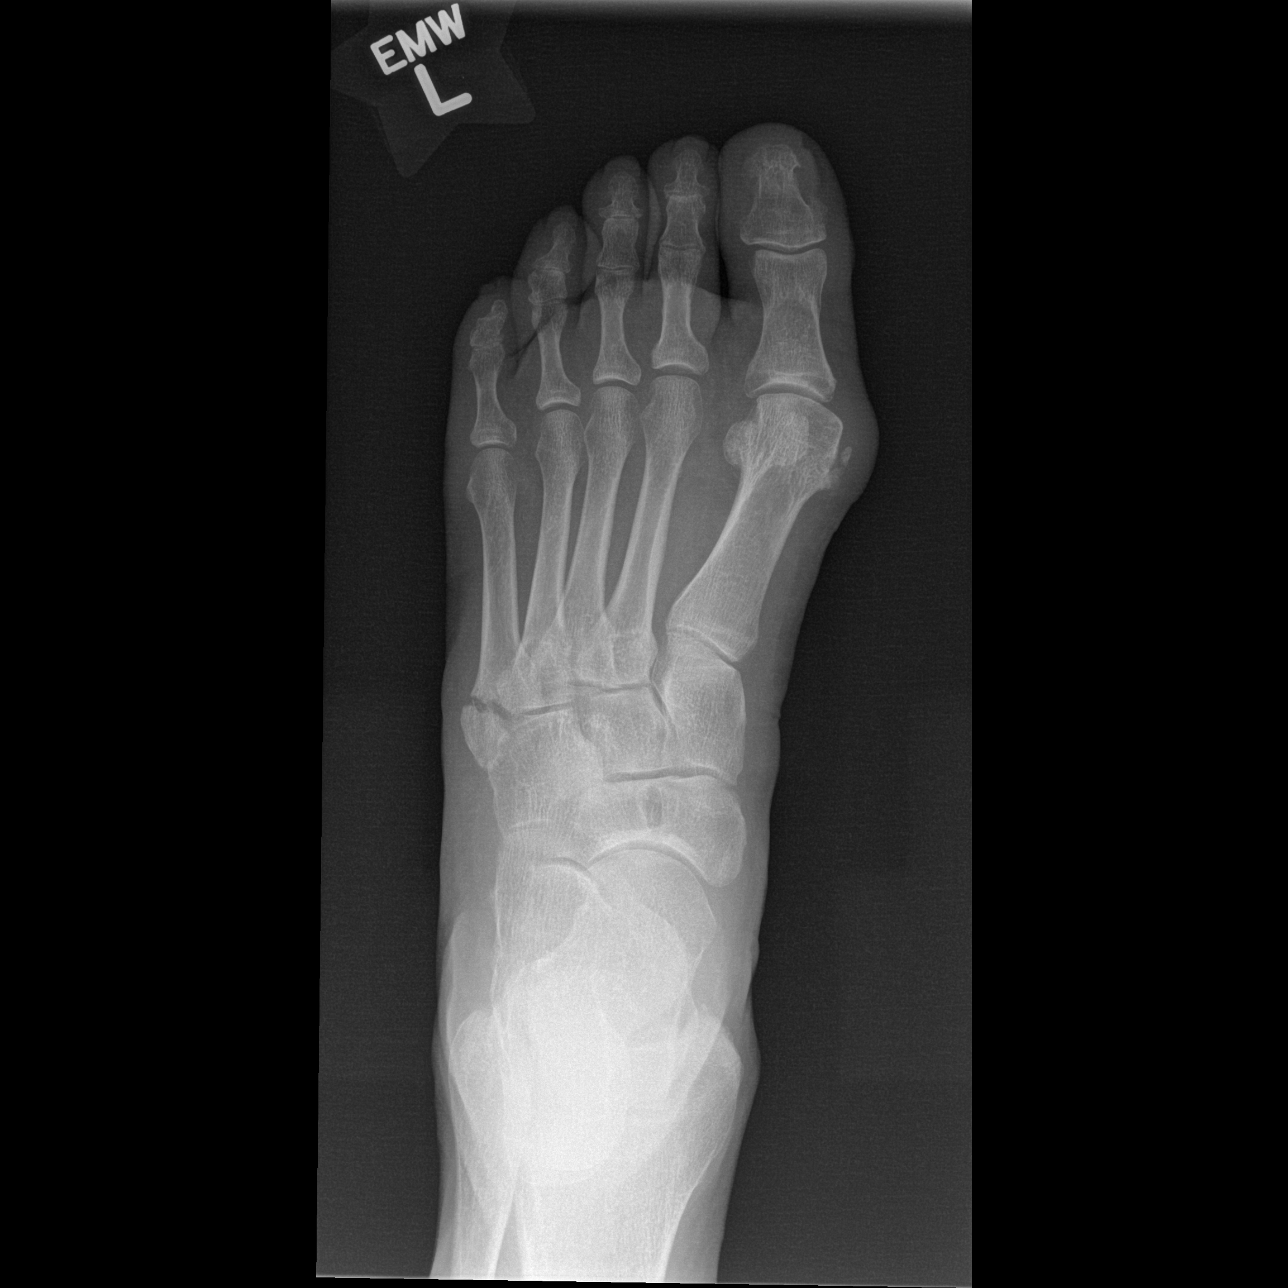

[t foot oblique left]
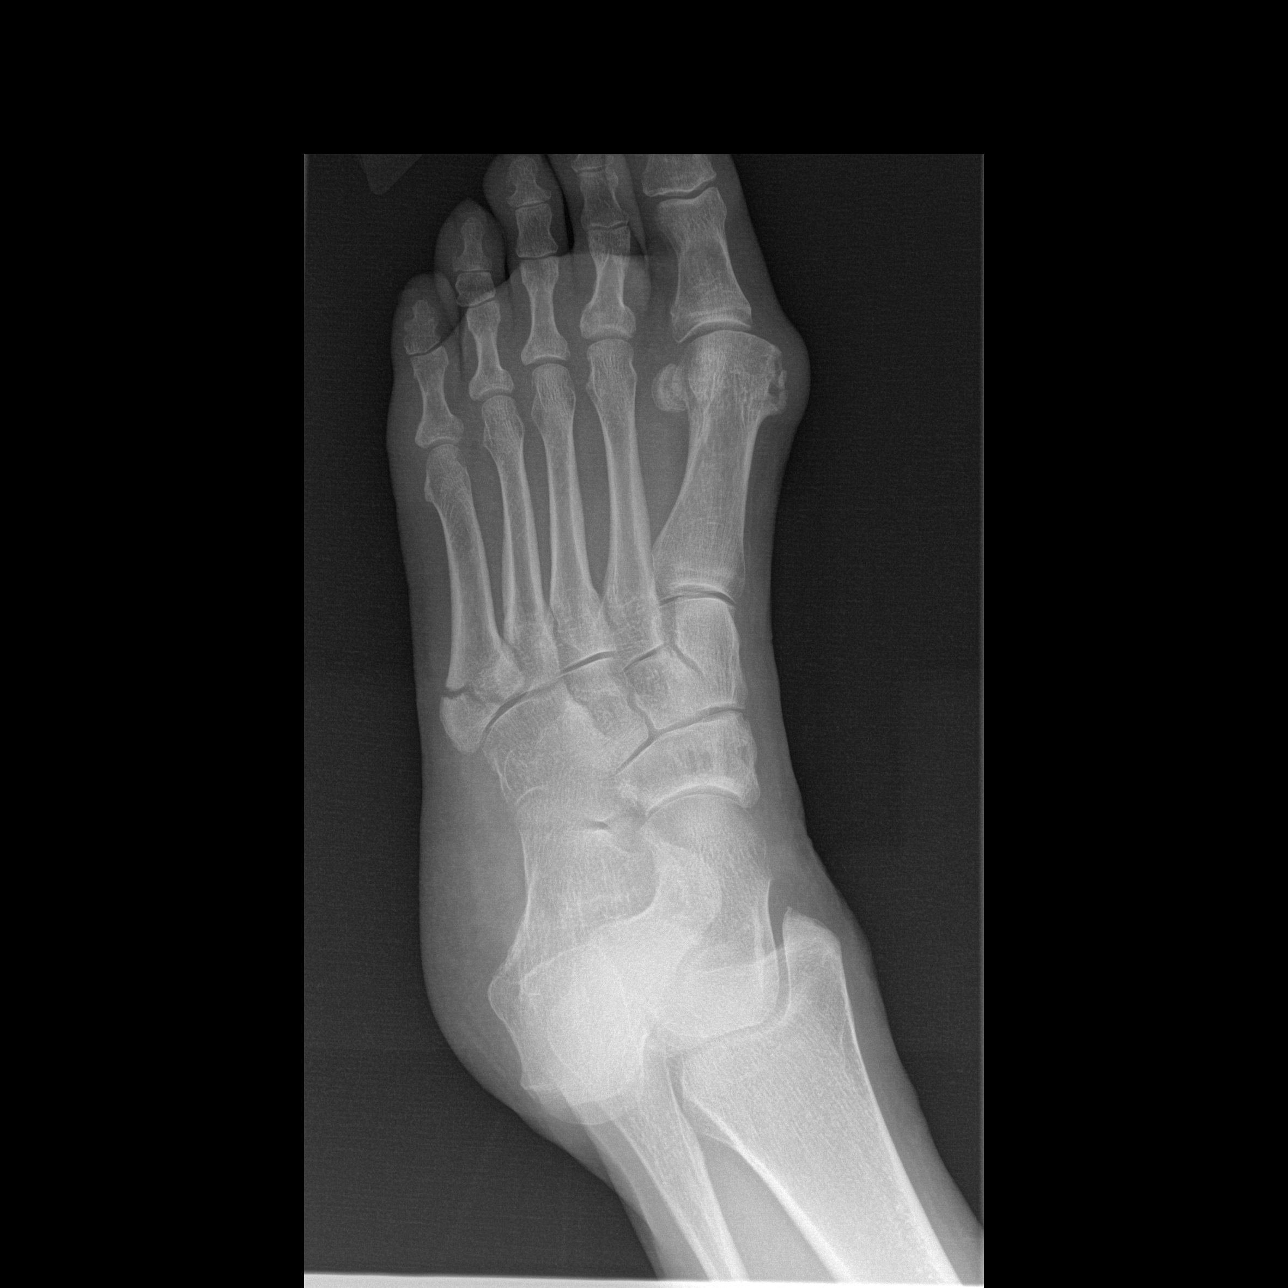

[t foot lat left]
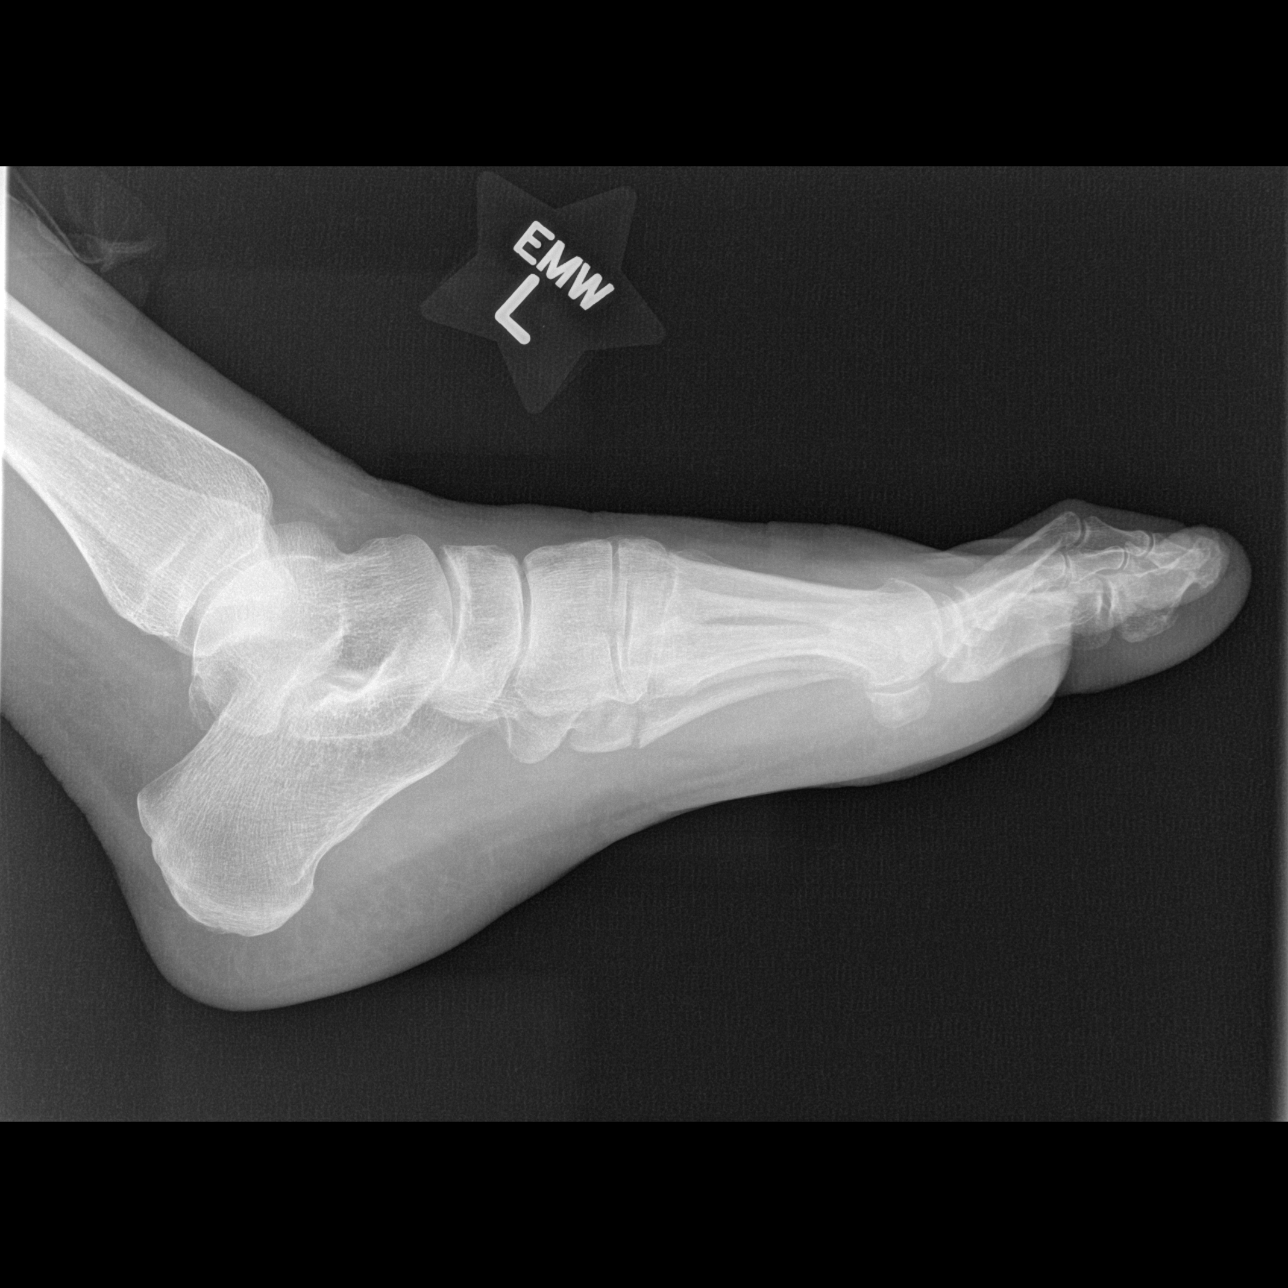

[3 of 3 positions shown; findings below may reference images not displayed]

FINDINGS: There is a fracture through the base of the left fifth
metatarsal.  Minimal displacement.  Slight overlying soft tissue
swelling.  No additional acute bony abnormality.  Mild hallux
valgus.
IMPRESSION: Minimally-displaced fracture through the base of the left fifth
metatarsal.

## 2017-03-08 HISTORY — PX: ESOPHAGOGASTRODUODENOSCOPY ENDOSCOPY: SHX5814

## 2017-04-02 ENCOUNTER — Other Ambulatory Visit: Payer: Self-pay | Admitting: Neurosurgery

## 2017-04-06 NOTE — Pre-Procedure Instructions (Addendum)
    Cliffie Gingras  04/06/2017      [id number] - [place], [place] - [address] [place] [address] Phone: [phone] Fax: [phone]    Your procedure is scheduled on Thursday, May 10th   Report to [facility] Admitting at 6:00 am.             (posted surgery time is 8:00 - 10:17 am)   Call this number if you have problems the [facility] of surgery:  [phone]. For other questions call [phone] Mon-Fri from 8-4:30 pm   Remember:  Do not eat food or drink liquids after midnight Wednesday.   Take these medicines the morning of surgery with A SIP OF WATER : Levothyroxine, Pantoprazole, Tramadol.              4-5 days prior to surgery, STOP TAKING any Vitamins, Anti-inflammatories, Herbal Supplements.   Do not wear jewelry, make-up or nail polish.  Do not wear lotions, powders, or perfumes, or deoderant.  Do not shave underarms & legs 48 hours prior to surgery.     Do not bring valuables to the hospital.  [facility] is not responsible for any belongings or valuables.  Contacts, dentures or bridgework may not be worn into surgery.  Leave your suitcase in the car.  After surgery it may be brought to your room.  For patients admitted to the hospital, discharge time will be determined by your treatment team.  Please read over the following fact sheets that you were given. Pain Booklet, MRSA Information and Surgical Site Infection Prevention

## 2017-04-07 ENCOUNTER — Encounter (HOSPITAL_COMMUNITY)
Admission: RE | Admit: 2017-04-07 | Discharge: 2017-04-07 | Disposition: A | Discharge status: Home / Self Care | Payer: Medicare HMO | Source: Ambulatory Visit | Attending: Neurosurgery | Admitting: Neurosurgery

## 2017-04-07 ENCOUNTER — Encounter (HOSPITAL_COMMUNITY): Payer: Self-pay

## 2017-04-07 DIAGNOSIS — M4802 Spinal stenosis, cervical region: Secondary | ICD-10-CM | POA: Insufficient documentation

## 2017-04-07 DIAGNOSIS — Z01812 Encounter for preprocedural laboratory examination: Secondary | ICD-10-CM | POA: Diagnosis not present

## 2017-04-07 HISTORY — DX: Personal history of other diseases of the digestive system: Z87.19

## 2017-04-07 HISTORY — DX: Gastro-esophageal reflux disease without esophagitis: K21.9

## 2017-04-07 HISTORY — DX: Unspecified osteoarthritis, unspecified site: M19.90

## 2017-04-07 LAB — CBC WITH DIFFERENTIAL/PLATELET
BASOS ABS: 0 10*3/uL (ref 0.0–0.1)
Basophils Relative: 0 %
Eosinophils Absolute: 0.8 10*3/uL — ABNORMAL HIGH (ref 0.0–0.7)
Eosinophils Relative: 12 %
HEMATOCRIT: 43.3 % (ref 36.0–46.0)
HEMOGLOBIN: 14.4 g/dL (ref 12.0–15.0)
LYMPHS PCT: 26 %
Lymphs Abs: 1.8 10*3/uL (ref 0.7–4.0)
MCH: 30 pg (ref 26.0–34.0)
MCHC: 33.3 g/dL (ref 30.0–36.0)
MCV: 90.2 fL (ref 78.0–100.0)
MONO ABS: 0.6 10*3/uL (ref 0.1–1.0)
Monocytes Relative: 8 %
NEUTROS ABS: 3.8 10*3/uL (ref 1.7–7.7)
NEUTROS PCT: 54 %
Platelets: 175 10*3/uL (ref 150–400)
RBC: 4.8 MIL/uL (ref 3.87–5.11)
RDW: 13.3 % (ref 11.5–15.5)
WBC: 7.1 10*3/uL (ref 4.0–10.5)

## 2017-04-07 LAB — SURGICAL PCR SCREEN
MRSA, PCR: NEGATIVE
Staphylococcus aureus: NEGATIVE

## 2017-04-07 LAB — BASIC METABOLIC PANEL
ANION GAP: 7 (ref 5–15)
BUN: 10 mg/dL (ref 6–20)
CO2: 26 mmol/L (ref 22–32)
Calcium: 9.3 mg/dL (ref 8.9–10.3)
Chloride: 107 mmol/L (ref 101–111)
Creatinine, Ser: 0.82 mg/dL (ref 0.44–1.00)
GFR calc Af Amer: >60 mL/min (ref >60)
GLUCOSE: 98 mg/dL (ref 65–99)
POTASSIUM: 4.3 mmol/L (ref 3.5–5.1)
Sodium: 140 mmol/L (ref 135–145)

## 2017-04-07 NOTE — Progress Notes (Signed)
PCP: Dr. Romilda Garret @ [facility] in [place], [place] -request ekg tracing

## 2017-04-16 ENCOUNTER — Inpatient Hospital Stay (HOSPITAL_COMMUNITY)
Admission: RE | Admit: 2017-04-16 | Discharge: 2017-04-17 | DRG: 473 | Disposition: A | Discharge status: Home / Self Care | Payer: Medicare HMO | Source: Ambulatory Visit | Attending: Neurosurgery | Admitting: Neurosurgery

## 2017-04-16 ENCOUNTER — Inpatient Hospital Stay (HOSPITAL_COMMUNITY): Payer: Medicare HMO | Admitting: Anesthesiology

## 2017-04-16 ENCOUNTER — Encounter (HOSPITAL_COMMUNITY)
Admission: RE | Disposition: A | Discharge status: Home / Self Care | Payer: Self-pay | Source: Ambulatory Visit | Attending: Neurosurgery

## 2017-04-16 ENCOUNTER — Inpatient Hospital Stay (HOSPITAL_COMMUNITY): Payer: Medicare HMO

## 2017-04-16 ENCOUNTER — Encounter (HOSPITAL_COMMUNITY): Payer: Self-pay | Admitting: *Deleted

## 2017-04-16 DIAGNOSIS — E7800 Pure hypercholesterolemia, unspecified: Secondary | ICD-10-CM | POA: Diagnosis present

## 2017-04-16 DIAGNOSIS — M50122 Cervical disc disorder at C5-C6 level with radiculopathy: Secondary | ICD-10-CM | POA: Diagnosis present

## 2017-04-16 DIAGNOSIS — Z90710 Acquired absence of both cervix and uterus: Secondary | ICD-10-CM

## 2017-04-16 DIAGNOSIS — E039 Hypothyroidism, unspecified: Secondary | ICD-10-CM | POA: Diagnosis present

## 2017-04-16 DIAGNOSIS — Z7982 Long term (current) use of aspirin: Secondary | ICD-10-CM

## 2017-04-16 DIAGNOSIS — Z419 Encounter for procedure for purposes other than remedying health state, unspecified: Secondary | ICD-10-CM

## 2017-04-16 DIAGNOSIS — M5020 Other cervical disc displacement, unspecified cervical region: Secondary | ICD-10-CM | POA: Diagnosis present

## 2017-04-16 DIAGNOSIS — Z87891 Personal history of nicotine dependence: Secondary | ICD-10-CM

## 2017-04-16 DIAGNOSIS — M4802 Spinal stenosis, cervical region: Secondary | ICD-10-CM | POA: Diagnosis present

## 2017-04-16 DIAGNOSIS — K219 Gastro-esophageal reflux disease without esophagitis: Secondary | ICD-10-CM | POA: Diagnosis present

## 2017-04-16 DIAGNOSIS — M50123 Cervical disc disorder at C6-C7 level with radiculopathy: Secondary | ICD-10-CM | POA: Diagnosis present

## 2017-04-16 HISTORY — PX: ANTERIOR CERVICAL DECOMP/DISCECTOMY FUSION: SHX1161

## 2017-04-16 SURGERY — ANTERIOR CERVICAL DECOMPRESSION/DISCECTOMY FUSION 2 LEVELS
Anesthesia: General | Site: Spine Cervical

## 2017-04-16 MED ORDER — ROCURONIUM BROMIDE 10 MG/ML (PF) SYRINGE
PREFILLED_SYRINGE | INTRAVENOUS | Status: AC
  Filled 2017-04-16: qty 5

## 2017-04-16 MED ORDER — IBUPROFEN 200 MG PO TABS: 200.0000 mg | ORAL_TABLET | Freq: Two times a day (BID) | ORAL | Status: DC | PRN

## 2017-04-16 MED ORDER — PROPOFOL 10 MG/ML IV BOLUS
INTRAVENOUS | Status: AC
  Filled 2017-04-16: qty 20

## 2017-04-16 MED ORDER — HEMOSTATIC AGENTS (NO CHARGE) OPTIME
TOPICAL | Status: DC | PRN
  Administered 2017-04-16: 1 via TOPICAL

## 2017-04-16 MED ORDER — SODIUM CHLORIDE 0.9% FLUSH: 3.0000 mL | INTRAVENOUS | Status: DC | PRN

## 2017-04-16 MED ORDER — HYDROCODONE-ACETAMINOPHEN 5-325 MG PO TABS
1.0000 | ORAL_TABLET | ORAL | Status: DC | PRN
  Administered 2017-04-16 – 2017-04-17 (×6): 2 via ORAL
  Filled 2017-04-16 (×5): qty 2

## 2017-04-16 MED ORDER — NAPROXEN SODIUM 220 MG PO TABS: 220.0000 mg | ORAL_TABLET | Freq: Two times a day (BID) | ORAL | Status: DC | PRN

## 2017-04-16 MED ORDER — MIDAZOLAM HCL 5 MG/5ML IJ SOLN
INTRAMUSCULAR | Status: DC | PRN
  Administered 2017-04-16: 2 mg via INTRAVENOUS

## 2017-04-16 MED ORDER — PROPOFOL 10 MG/ML IV BOLUS
INTRAVENOUS | Status: DC | PRN
  Administered 2017-04-16: 200 mg via INTRAVENOUS

## 2017-04-16 MED ORDER — CYCLOBENZAPRINE HCL 10 MG PO TABS
10.0000 mg | ORAL_TABLET | Freq: Three times a day (TID) | ORAL | Status: DC | PRN
  Administered 2017-04-16 – 2017-04-17 (×3): 10 mg via ORAL
  Filled 2017-04-16 (×2): qty 1

## 2017-04-16 MED ORDER — HYDROMORPHONE HCL 1 MG/ML IJ SOLN
0.5000 mg | INTRAMUSCULAR | Status: DC | PRN
  Administered 2017-04-16: 1 mg via INTRAVENOUS
  Filled 2017-04-16: qty 1

## 2017-04-16 MED ORDER — THROMBIN 5000 UNITS EX SOLR
OROMUCOSAL | Status: DC | PRN
  Administered 2017-04-16: 5 mL via TOPICAL

## 2017-04-16 MED ORDER — LIDOCAINE HCL (CARDIAC) 20 MG/ML IV SOLN
INTRAVENOUS | Status: DC | PRN
  Administered 2017-04-16: 100 mg via INTRATRACHEAL

## 2017-04-16 MED ORDER — MIDAZOLAM HCL 2 MG/2ML IJ SOLN
INTRAMUSCULAR | Status: AC
  Filled 2017-04-16: qty 2

## 2017-04-16 MED ORDER — FENTANYL CITRATE (PF) 250 MCG/5ML IJ SOLN
INTRAMUSCULAR | Status: AC
  Filled 2017-04-16: qty 5

## 2017-04-16 MED ORDER — CEFAZOLIN SODIUM-DEXTROSE 2-3 GM-% IV SOLR
INTRAVENOUS | Status: DC | PRN
  Administered 2017-04-16: 2 g via INTRAVENOUS

## 2017-04-16 MED ORDER — LACTATED RINGERS IV SOLN
INTRAVENOUS | Status: DC
  Administered 2017-04-16 (×2): via INTRAVENOUS

## 2017-04-16 MED ORDER — PHENOL 1.4 % MT LIQD
1.0000 | OROMUCOSAL | Status: DC | PRN
  Administered 2017-04-17: 1 via OROMUCOSAL
  Filled 2017-04-16: qty 177

## 2017-04-16 MED ORDER — CEFAZOLIN SODIUM-DEXTROSE 2-4 GM/100ML-% IV SOLN
INTRAVENOUS | Status: AC
  Filled 2017-04-16: qty 100

## 2017-04-16 MED ORDER — ONDANSETRON HCL 4 MG/2ML IJ SOLN: 4.0000 mg | Freq: Four times a day (QID) | INTRAMUSCULAR | Status: DC | PRN

## 2017-04-16 MED ORDER — SUGAMMADEX SODIUM 200 MG/2ML IV SOLN
INTRAVENOUS | Status: DC | PRN
  Administered 2017-04-16: 200 mg via INTRAVENOUS

## 2017-04-16 MED ORDER — SODIUM CHLORIDE 0.9% FLUSH
3.0000 mL | Freq: Two times a day (BID) | INTRAVENOUS | Status: DC
  Administered 2017-04-16: 3 mL via INTRAVENOUS

## 2017-04-16 MED ORDER — ONDANSETRON HCL 4 MG PO TABS: 4.0000 mg | ORAL_TABLET | Freq: Four times a day (QID) | ORAL | Status: DC | PRN

## 2017-04-16 MED ORDER — SIMVASTATIN 40 MG PO TABS
40.0000 mg | ORAL_TABLET | Freq: Every day | ORAL | Status: DC
  Administered 2017-04-16: 40 mg via ORAL
  Filled 2017-04-16 (×2): qty 1

## 2017-04-16 MED ORDER — PHENYLEPHRINE HCL 10 MG/ML IJ SOLN
INTRAVENOUS | Status: DC | PRN
  Administered 2017-04-16: 25 ug/min via INTRAVENOUS

## 2017-04-16 MED ORDER — SODIUM CHLORIDE 0.9 % IR SOLN
Status: DC | PRN
  Administered 2017-04-16: 500 mL

## 2017-04-16 MED ORDER — CYCLOBENZAPRINE HCL 10 MG PO TABS
ORAL_TABLET | ORAL | Status: AC
  Filled 2017-04-16: qty 1

## 2017-04-16 MED ORDER — DEXAMETHASONE SODIUM PHOSPHATE 10 MG/ML IJ SOLN
INTRAMUSCULAR | Status: AC
  Filled 2017-04-16: qty 1

## 2017-04-16 MED ORDER — LEVOTHYROXINE SODIUM 75 MCG PO TABS
75.0000 ug | ORAL_TABLET | Freq: Every day | ORAL | Status: DC
  Administered 2017-04-17: 75 ug via ORAL
  Filled 2017-04-16: qty 1

## 2017-04-16 MED ORDER — PANTOPRAZOLE SODIUM 40 MG PO TBEC
40.0000 mg | DELAYED_RELEASE_TABLET | Freq: Every day | ORAL | Status: DC
  Administered 2017-04-17: 40 mg via ORAL
  Filled 2017-04-16: qty 1

## 2017-04-16 MED ORDER — THROMBIN 5000 UNITS EX SOLR
CUTANEOUS | Status: AC
  Filled 2017-04-16: qty 10000

## 2017-04-16 MED ORDER — FUROSEMIDE 20 MG PO TABS: 20.0000 mg | ORAL_TABLET | Freq: Every day | ORAL | Status: DC

## 2017-04-16 MED ORDER — FENTANYL CITRATE (PF) 250 MCG/5ML IJ SOLN
INTRAMUSCULAR | Status: DC | PRN
  Administered 2017-04-16: 100 ug via INTRAVENOUS
  Administered 2017-04-16 (×2): 50 ug via INTRAVENOUS
  Administered 2017-04-16: 200 ug via INTRAVENOUS

## 2017-04-16 MED ORDER — FENTANYL CITRATE (PF) 100 MCG/2ML IJ SOLN: 25.0000 ug | INTRAMUSCULAR | Status: DC | PRN

## 2017-04-16 MED ORDER — FENTANYL CITRATE (PF) 100 MCG/2ML IJ SOLN
INTRAMUSCULAR | Status: AC
  Filled 2017-04-16: qty 2

## 2017-04-16 MED ORDER — MENTHOL 3 MG MT LOZG: 1.0000 | LOZENGE | OROMUCOSAL | Status: DC | PRN

## 2017-04-16 MED ORDER — ROCURONIUM BROMIDE 100 MG/10ML IV SOLN
INTRAVENOUS | Status: DC | PRN
  Administered 2017-04-16: 50 mg via INTRAVENOUS

## 2017-04-16 MED ORDER — THROMBIN 5000 UNITS EX SOLR
CUTANEOUS | Status: AC
  Filled 2017-04-16: qty 5000

## 2017-04-16 MED ORDER — DEXAMETHASONE SODIUM PHOSPHATE 10 MG/ML IJ SOLN
INTRAMUSCULAR | Status: DC | PRN
  Administered 2017-04-16: 10 mg via INTRAVENOUS

## 2017-04-16 MED ORDER — 0.9 % SODIUM CHLORIDE (POUR BTL) OPTIME
TOPICAL | Status: DC | PRN
  Administered 2017-04-16: 1000 mL

## 2017-04-16 MED ORDER — ONDANSETRON HCL 4 MG/2ML IJ SOLN
INTRAMUSCULAR | Status: DC | PRN
  Administered 2017-04-16: 4 mg via INTRAVENOUS

## 2017-04-16 MED ORDER — DIPHENHYDRAMINE HCL 50 MG/ML IJ SOLN
INTRAMUSCULAR | Status: DC | PRN
  Administered 2017-04-16: 12.5 mg via INTRAVENOUS

## 2017-04-16 MED ORDER — LABETALOL HCL 5 MG/ML IV SOLN
INTRAVENOUS | Status: DC | PRN
  Administered 2017-04-16: 10 mg via INTRAVENOUS

## 2017-04-16 MED ORDER — GLYCOPYRROLATE 0.2 MG/ML IJ SOLN
INTRAMUSCULAR | Status: DC | PRN
  Administered 2017-04-16: .2 mg via INTRAVENOUS

## 2017-04-16 MED ORDER — HYDROCODONE-ACETAMINOPHEN 5-325 MG PO TABS
ORAL_TABLET | ORAL | Status: AC
  Filled 2017-04-16: qty 2

## 2017-04-16 MED ORDER — ALBUTEROL SULFATE HFA 108 (90 BASE) MCG/ACT IN AERS
INHALATION_SPRAY | RESPIRATORY_TRACT | Status: DC | PRN
  Administered 2017-04-16: 2 via RESPIRATORY_TRACT

## 2017-04-16 MED ORDER — THROMBIN 5000 UNITS EX SOLR
CUTANEOUS | Status: DC | PRN
  Administered 2017-04-16 (×2): 5000 [IU] via TOPICAL

## 2017-04-16 MED ORDER — CEFAZOLIN SODIUM-DEXTROSE 1-4 GM/50ML-% IV SOLN
1.0000 g | Freq: Three times a day (TID) | INTRAVENOUS | Status: AC
  Administered 2017-04-16 (×2): 1 g via INTRAVENOUS
  Filled 2017-04-16 (×2): qty 50

## 2017-04-16 MED ORDER — FENTANYL CITRATE (PF) 100 MCG/2ML IJ SOLN
25.0000 ug | INTRAMUSCULAR | Status: DC | PRN
  Administered 2017-04-16 (×2): 50 ug via INTRAVENOUS

## 2017-04-16 MED ORDER — LABETALOL HCL 5 MG/ML IV SOLN
INTRAVENOUS | Status: AC
  Filled 2017-04-16: qty 4

## 2017-04-16 MED ORDER — TRAMADOL HCL 50 MG PO TABS: 50.0000 mg | ORAL_TABLET | Freq: Every day | ORAL | Status: DC | PRN

## 2017-04-16 SURGICAL SUPPLY — 57 items
BAG DECANTER FOR FLEXI CONT (MISCELLANEOUS) ×3 IMPLANT
BENZOIN TINCTURE PRP APPL 2/3 (GAUZE/BANDAGES/DRESSINGS) ×3 IMPLANT
BIT DRILL 13 (BIT) ×2 IMPLANT
BIT DRILL 13MM (BIT) ×1
BUR MATCHSTICK NEURO 3.0 LAGG (BURR) ×3 IMPLANT
CANISTER SUCT 3000ML PPV (MISCELLANEOUS) ×3 IMPLANT
CARTRIDGE OIL MAESTRO DRILL (MISCELLANEOUS) ×1 IMPLANT
CLOSURE WOUND 1/2 X4 (GAUZE/BANDAGES/DRESSINGS) ×1
DIFFUSER DRILL AIR PNEUMATIC (MISCELLANEOUS) ×3 IMPLANT
DRAPE C-ARM 42X72 X-RAY (DRAPES) ×6 IMPLANT
DRAPE LAPAROTOMY 100X72 PEDS (DRAPES) ×3 IMPLANT
DRAPE MICROSCOPE LEICA (MISCELLANEOUS) ×3 IMPLANT
DRAPE POUCH INSTRU U-SHP 10X18 (DRAPES) ×3 IMPLANT
DURAPREP 6ML APPLICATOR 50/CS (WOUND CARE) ×3 IMPLANT
ELECT COATED BLADE 2.86 ST (ELECTRODE) ×3 IMPLANT
ELECT REM PT RETURN 9FT ADLT (ELECTROSURGICAL) ×3
ELECTRODE REM PT RTRN 9FT ADLT (ELECTROSURGICAL) ×1 IMPLANT
GAUZE SPONGE 4X4 12PLY STRL (GAUZE/BANDAGES/DRESSINGS) ×3 IMPLANT
GAUZE SPONGE 4X4 16PLY XRAY LF (GAUZE/BANDAGES/DRESSINGS) IMPLANT
GLOVE BIOGEL PI IND STRL 7.5 (GLOVE) ×2 IMPLANT
GLOVE BIOGEL PI INDICATOR 7.5 (GLOVE) ×4
GLOVE ECLIPSE 9.0 STRL (GLOVE) ×3 IMPLANT
GLOVE EXAM NITRILE LRG STRL (GLOVE) IMPLANT
GLOVE EXAM NITRILE XL STR (GLOVE) IMPLANT
GLOVE EXAM NITRILE XS STR PU (GLOVE) IMPLANT
GLOVE SURG SS PI 7.0 STRL IVOR (GLOVE) ×6 IMPLANT
GOWN STRL REUS W/ TWL LRG LVL3 (GOWN DISPOSABLE) ×1 IMPLANT
GOWN STRL REUS W/ TWL XL LVL3 (GOWN DISPOSABLE) ×2 IMPLANT
GOWN STRL REUS W/TWL 2XL LVL3 (GOWN DISPOSABLE) IMPLANT
GOWN STRL REUS W/TWL LRG LVL3 (GOWN DISPOSABLE) ×2
GOWN STRL REUS W/TWL XL LVL3 (GOWN DISPOSABLE) ×4
HALTER HD/CHIN CERV TRACTION D (MISCELLANEOUS) ×3 IMPLANT
HEMOSTAT POWDER SURGIFOAM 1G (HEMOSTASIS) ×3 IMPLANT
HEMOSTAT SURGICEL 2X14 (HEMOSTASIS) IMPLANT
KIT BASIN OR (CUSTOM PROCEDURE TRAY) ×3 IMPLANT
KIT ROOM TURNOVER OR (KITS) ×3 IMPLANT
NEEDLE SPNL 20GX3.5 QUINCKE YW (NEEDLE) ×3 IMPLANT
NS IRRIG 1000ML POUR BTL (IV SOLUTION) ×3 IMPLANT
OIL CARTRIDGE MAESTRO DRILL (MISCELLANEOUS) ×3
PACK LAMINECTOMY NEURO (CUSTOM PROCEDURE TRAY) ×3 IMPLANT
PAD ARMBOARD 7.5X6 YLW CONV (MISCELLANEOUS) ×9 IMPLANT
PLATE ELITE 42MM (Plate) ×3 IMPLANT
RUBBERBAND STERILE (MISCELLANEOUS) ×6 IMPLANT
SCREW ST 13X4XST VA NS SPNE (Screw) ×6 IMPLANT
SCREW ST VAR 4 ATL (Screw) ×12 IMPLANT
SPACER BONE CORNERSTONE 6X14 (Orthopedic Implant) ×6 IMPLANT
SPONGE INTESTINAL PEANUT (DISPOSABLE) ×3 IMPLANT
SPONGE SURGIFOAM ABS GEL SZ50 (HEMOSTASIS) ×3 IMPLANT
STRIP CLOSURE SKIN 1/2X4 (GAUZE/BANDAGES/DRESSINGS) ×2 IMPLANT
SUT VIC AB 3-0 SH 8-18 (SUTURE) ×3 IMPLANT
SUT VIC AB 4-0 RB1 18 (SUTURE) ×3 IMPLANT
TAPE CLOTH 4X10 WHT NS (GAUZE/BANDAGES/DRESSINGS) ×3 IMPLANT
TAPE CLOTH SURG 4X10 WHT LF (GAUZE/BANDAGES/DRESSINGS) ×3 IMPLANT
TOWEL GREEN STERILE (TOWEL DISPOSABLE) ×3 IMPLANT
TOWEL GREEN STERILE FF (TOWEL DISPOSABLE) ×2 IMPLANT
TRAP SPECIMEN MUCOUS 40CC (MISCELLANEOUS) ×3 IMPLANT
WATER STERILE IRR 1000ML POUR (IV SOLUTION) ×3 IMPLANT

## 2017-04-16 NOTE — Anesthesia Procedure Notes (Signed)
Procedure Name: Intubation Date/Time: 04/16/2017 8:09 AM Performed by: Marena ChancyBECKNER, Janeece Blok S Pre-anesthesia Checklist: Patient identified, Emergency Drugs available, Suction available and Patient being monitored Patient Re-evaluated:Patient Re-evaluated prior to inductionOxygen Delivery Method: Circle System Utilized Preoxygenation: Pre-oxygenation with 100% oxygen Intubation Type: IV induction Ventilation: Mask ventilation without difficulty Laryngoscope Size: Miller and 2 Grade View: Grade I Tube type: Oral Tube size: 7.0 mm Number of attempts: 1 Airway Equipment and Method: Stylet and Oral airway Placement Confirmation: ETT inserted through vocal cords under direct vision,  positive ETCO2 and breath sounds checked- equal and bilateral Tube secured with: Tape Dental Injury: Teeth and Oropharynx as per pre-operative assessment

## 2017-04-16 NOTE — Anesthesia Postprocedure Evaluation (Signed)
Anesthesia Post Note  Patient: Natalie Sanders  Procedure(s) Performed: Procedure(s) (LRB): ANTERIOR CERVICAL DECOMPRESSION/FUSION CERVICAL FIVE-SIX, CERVICAL SIX-SEVEN (N/A)  Patient location during evaluation: PACU Anesthesia Type: General Level of consciousness: awake Pain management: pain level controlled Vital Signs Assessment: post-procedure vital signs reviewed and stable Respiratory status: spontaneous breathing Cardiovascular status: stable Anesthetic complications: no       Last Vitals:  Vitals:   04/16/17 1115 04/16/17 1130  BP: (!) 142/80 (!) 147/64  Pulse:  78  Resp:  18  Temp: 36.7 C 37.2 C    Last Pain:  Vitals:   04/16/17 1130  TempSrc: Oral  PainSc:                  [name]

## 2017-04-16 NOTE — Anesthesia Preprocedure Evaluation (Addendum)
Anesthesia Evaluation  Patient identified by MRN, date of birth, ID band Patient awake    Reviewed: Allergy & Precautions, NPO status , Patient's Chart, lab work & pertinent test results  Airway Mallampati: II  TM Distance: >3 FB     Dental   Pulmonary former smoker,    breath sounds clear to auscultation       Cardiovascular negative cardio ROS   Rhythm:Regular Rate:Normal     Neuro/Psych    GI/Hepatic Neg liver ROS, hiatal hernia, GERD  ,  Endo/Other  Hypothyroidism   Renal/GU negative Renal ROS     Musculoskeletal  (+) Arthritis ,   Abdominal   Peds  Hematology   Anesthesia Other Findings   Reproductive/Obstetrics                            Anesthesia Physical Anesthesia Plan  ASA: III  Anesthesia Plan: General   Post-op Pain Management:    Induction: Intravenous  Airway Management Planned: Oral ETT  Additional Equipment:   Intra-op Plan:   Post-operative Plan: Possible Post-op intubation/ventilation  Informed Consent: I have reviewed the patients History and Physical, chart, labs and discussed the procedure including the risks, benefits and alternatives for the proposed anesthesia with the patient or authorized representative who has indicated his/her understanding and acceptance.   Dental advisory given  Plan Discussed with: CRNA and Anesthesiologist  Anesthesia Plan Comments:         Anesthesia Quick Evaluation

## 2017-04-16 NOTE — Anesthesia Postprocedure Evaluation (Signed)
Anesthesia Post Note  Patient: Natalie Sanders  Procedure(s) Performed: Procedure(s) (LRB): ANTERIOR CERVICAL DECOMPRESSION/FUSION CERVICAL FIVE-SIX, CERVICAL SIX-SEVEN (N/A)  Patient location during evaluation: PACU Anesthesia Type: General Level of consciousness: awake Pain management: pain level controlled Vital Signs Assessment: post-procedure vital signs reviewed and stable Respiratory status: spontaneous breathing Cardiovascular status: stable Anesthetic complications: no       Last Vitals:  Vitals:   04/16/17 1100 04/16/17 1115  BP: (!) 160/86 (!) 142/80  Pulse:    Resp:    Temp:  36.7 C    Last Pain:  Vitals:   04/16/17 1115  TempSrc:   PainSc: Asleep                 Rashard Ryle

## 2017-04-16 NOTE — Op Note (Signed)
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  Asst. Surgeon: None  Anesthesia: General  Indication: 66 year old female with neck and left upper extremity pain paresthesias and weakness since with a left-sided C7 radiculopathy which is failed conservative management. Patient presents now for two-level anterior cervical decompression and fusion.  Operative note: After induction of anesthesia, patient position supine with her neck slightly extended and held in place of halter traction. Anterior server region prepped and draped sterilely. Incision made overlying C6. Dissection performed the right. Retractor placed. Fluoroscopy used. Levels confirmed. Disc spaces incised at both C5-6 and C6-7. Discectomies performed with various instruments down to level the posterior annulus. Microscope then brought into the field used throughout the remainder of the discectomy. Remaining aspects of annulus and osteophytes removed using high-speed drill down to level posterior longitudinal limb. Posterior longitudinal ligament was elevated and resected in piecemeal fashion. Underlying thecal sac was identified. A wide central decompression was then performed by undercutting the bodies of C5 and C6. Decompression then proceeded into each neural foramen. Wide anterior foraminotomies were performed on course exiting C6 nerve roots bilaterally. At this point a very thorough decompression been achieved. There was no evidence of injury to the thecal sac or nerve roots. Wound is then irrigated MI solution. Gelfoam was placed topically. Hemostasis was then repeated at C6-7 again without complications. 6  mm allograft wedges were then impacted into place and recessed slightly from the anterior cortical margin. 42 mm Atlantis anterior cervical plate was then placed over the C5-C7 level. This attached under fluoroscopic guidance using 13 L Verlie no screws 2 each at all 3 levels. All screws given a final tightening found be solidly within the bone. Locking screws and gauge at all levels. Final images revealed good position the bone graft and hardware at proper upper level with normal alignment is fine. Wounds and irrigated one final time. Hemostasis was assured with bipolar lash cautery. Wounds and close in layers of Vicryl sutures. Steri-Strips and sterile dressing were applied. There were no apparent complications. Patient tolerated procedure well and she returns to the recovery room postop.

## 2017-04-16 NOTE — H&P (Signed)
Natalie Sanders is an 66 y.o. female.   Chief Complaint: Neck and left arm pain HPI: 66 year old female with progressive neck and left upper extremity pain, paresthesias and weakness. Symptoms consistent with a left-sided C7 radiculopathy. Patient has failed conservative management. Workup demonstrates evidence of marked disc degeneration with a leftward C6-7 disc herniation and C7 nerve root compression. Patient also with significant spondylosis and foraminal stenosis at C5-6. She presents now for two-level anterior cervical decompression and fusion in hopes of improving her symptoms.  Past Medical History:  Diagnosis Date  . Arthritis   . Complication of anesthesia    hypotension after cholecystectomy  . Difficulty swallowing pills    especially large pills  . 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 Full dentures   . GERD (gastroesophageal reflux disease)   . High cholesterol   . History of hiatal hernia   . Hypothyroidism     Past Surgical History:  Procedure Laterality Date  . ABDOMINAL HYSTERECTOMY     partial  . ANKLE RECONSTRUCTION Left 02/23/2014   Procedure: LEFT FIFTH BASE EXOSTOSIS/PERONEUS BREVIS REPAIR;  Surgeon: Toni ArthursJohn Hewitt, MD;  Location: [facility];  Service: Orthopedics;  Laterality: Left;  . APPENDECTOMY    . CHOLECYSTECTOMY  2005  . ESOPHAGOGASTRODUODENOSCOPY ENDOSCOPY  03/2017  . IR ESOPHAGUS DILITATION RETRO FLUORO      History reviewed. No pertinent family history. Social History:  reports that she quit smoking about 10 years ago. She has never used smokeless tobacco. She reports that she does not drink alcohol or use drugs.  Allergies:  Allergies  Allergen Reactions  . Darvon [Propoxyphene] Nausea And Vomiting  . Liraglutide Nausea Only  . Lorcaserin Other (See Comments)    HEADACHE  . Naltrexone-Bupropion Hcl Er Other (See Comments)    "UNABLE TO SWALLOW TABLETS"    Medications  Prior to Admission  Medication Sig Dispense Refill  . furosemide (LASIX) 40 MG tablet Take 20 mg by mouth daily. Should be completed by 04-06-17 only on for 6 days  0  . ibuprofen (ADVIL,MOTRIN) 200 MG tablet Take 200 mg by mouth 2 (two) times daily as needed for mild pain.    Marland Kitchen. levothyroxine (SYNTHROID) 75 MCG tablet Take 75 mcg by mouth daily before breakfast.     . meloxicam (MOBIC) 7.5 MG tablet Take 7.5 mg by mouth 2 (two) times daily as needed for pain.     . naproxen sodium (ANAPROX) 220 MG tablet Take 220 mg by mouth 2 (two) times daily as needed (pain).    . pantoprazole (PROTONIX) 40 MG tablet Take 40 mg by mouth daily after breakfast.    . simvastatin (ZOCOR) 40 MG tablet Take 40 mg by mouth daily at 6 PM.     . traMADol (ULTRAM) 50 MG tablet Take 50 mg by mouth daily as needed for moderate pain.    Marland Kitchen. aspirin EC 325 MG tablet Take 1 tablet (325 mg total) by mouth daily. (Patient not taking: Reported on 04/03/2017) 14 tablet 0  . oxyCODONE (ROXICODONE) 5 MG immediate release tablet Take 1-2 tablets (5-10 mg total) by mouth every 4 (four) hours as needed for moderate pain or severe pain. (Patient not taking: Reported on 04/03/2017) 30 tablet 0    No results found for this or any previous visit (from the past 48 hour(s)). No results found.  Pertinent items noted in HPI and remainder of comprehensive ROS otherwise negative.  Blood pressure (!) 124/48, pulse 64, temperature 97.9 F (36.6 C), temperature source Oral, resp. rate 18, SpO2 96 %.  Patient is awake and alert. She is oriented and appropriate. Her cranial nerve function is intact. Her motor and sensory function extremities reveals weakness of her left triceps grating out at 4 minus over 5. She has mild grip weakness on the left. Remainder of her motor strength intact. Sensory examination with decreased sensation to pinprick and light touch in her left C7 dermatome. Deep tendon reflexes normal active. No evidence of long track signs.  Gait posture normal. Examination head ears eyes and throat are marked. Chest and abdomen benign. Extremities are free from injury deformity. Assessment/Plan C5-6, C6-7 herniated nucleus pulposus with stenosis. Plan C5-6, C6-7 anterior cervical discectomy with interbody fusion using interbody allograft and anterior plate instrumentation. Risks and benefits of been explained. Patient wishes to proceed.  Jackline Castilla A 04/16/2017, 7:46 AM

## 2017-04-16 NOTE — Transfer of Care (Signed)
Immediate Anesthesia Transfer of Care Note  Patient: Charlestine NightGloria Defibaugh  Procedure(s) Performed: Procedure(s) with comments: ANTERIOR CERVICAL DECOMPRESSION/FUSION CERVICAL FIVE-SIX, CERVICAL SIX-SEVEN (N/A) - right side approach  Patient Location: PACU  Anesthesia Type:General  Level of Consciousness: awake, alert  and oriented  Airway & Oxygen Therapy: Patient Spontanous Breathing and Patient connected to face mask oxygen  Post-op Assessment: Report given to RN, Post -op Vital signs reviewed and stable and Patient moving all extremities X 4  Post vital signs: Reviewed and stable  Last Vitals:  Vitals:   04/16/17 0608 04/16/17 0953  BP: (!) 124/48 133/89  Pulse: 64 96  Resp: 18 19  Temp: 36.6 C 36.4 C    Last Pain:  Vitals:   04/16/17 0643  TempSrc:   PainSc: 4       Patients Stated Pain Goal: 1 (04/16/17 [id number])  Complications: No apparent anesthesia complications

## 2017-04-16 NOTE — Brief Op Note (Signed)
04/16/2017  9:45 AM  PATIENT:  Natalie Sanders  66 y.o. female  PRE-OPERATIVE DIAGNOSIS:  Stenosis  POST-OPERATIVE DIAGNOSIS:  Stenosis  PROCEDURE:  Procedure(s) with comments: ANTERIOR CERVICAL DECOMPRESSION/FUSION CERVICAL FIVE-SIX, CERVICAL SIX-SEVEN (N/A) - right side approach  SURGEON:  Surgeon(s) and Role:    * Julio SicksPool, Mauriana Dann, MD - Primary  PHYSICIAN ASSISTANT:   ASSISTANTS:    ANESTHESIA:   general  EBL:  Total I/O In: 1000 [I.V.:1000] Out: -   BLOOD ADMINISTERED:none  DRAINS: none   LOCAL MEDICATIONS USED:  NONE  SPECIMEN:  No Specimen  DISPOSITION OF SPECIMEN:  N/A  COUNTS:  YES  TOURNIQUET:  * No tourniquets in log *  DICTATION: .Dragon Dictation  PLAN OF CARE: Admit to inpatient   PATIENT DISPOSITION:  PACU - hemodynamically stable.   Delay start of Pharmacological VTE agent (>24hrs) due to surgical blood loss or risk of bleeding: yes

## 2017-04-17 ENCOUNTER — Encounter (HOSPITAL_COMMUNITY): Payer: Self-pay | Admitting: Neurosurgery

## 2017-04-17 MED ORDER — CYCLOBENZAPRINE HCL 10 MG PO TABS: 10.0000 mg | ORAL_TABLET | Freq: Three times a day (TID) | ORAL | 0 refills | Status: DC | PRN

## 2017-04-17 MED ORDER — HYDROCODONE-ACETAMINOPHEN 5-325 MG PO TABS: 1.0000 | ORAL_TABLET | ORAL | 0 refills | Status: DC | PRN

## 2017-04-17 NOTE — Discharge Summary (Signed)
Physician Discharge Summary  Patient ID: Natalie Sanders MRN: [id number] DOB/AGE: 1951-02-14 66 y.o.  Admit date: 04/16/2017 Discharge date: 04/17/2017  Admission Diagnoses:  Discharge Diagnoses:  Active Problems:   Herniated disc, cervical   Discharged Condition: good  Hospital Course: Patient admitted to the hospital where she underwent an uncomplicated two-level anterior cervical decompression infusion. Postoperative she is doing well. Preoperative neck and upper extremity pain resolved. An voiding without difficulty. Swallowing well. Ready for discharge home.  Consults:   Significant Diagnostic Studies:   Treatments:   Discharge Exam: Blood pressure (!) 126/58, pulse 82, temperature 98.3 F (36.8 C), resp. rate 18, height 5\' 5"  (1.651 m), weight 101.6 kg (224 lb), SpO2 95 %. Awake and alert. Oriented and appropriate. Cranial nerve function intact. Motor and sensory function extremities intact. Wound clean and dry. Chest and abdomen benign.  Disposition: 01-Home or Self Care   Allergies as of 04/17/2017      Reactions   Darvon [propoxyphene] Nausea And Vomiting   Liraglutide Nausea Only   Lorcaserin Other (See Comments)   HEADACHE   Naltrexone-bupropion Hcl Er Other (See Comments)   "UNABLE TO SWALLOW TABLETS"      Medication List    TAKE these medications   aspirin EC 325 MG tablet Take 1 tablet (325 mg total) by mouth daily.   cyclobenzaprine 10 MG tablet Commonly known as:  FLEXERIL Take 1 tablet (10 mg total) by mouth 3 (three) times daily as needed for muscle spasms.   furosemide 40 MG tablet Commonly known as:  LASIX Take 20 mg by mouth daily. Should be completed by 04-06-17 only on for 6 days   HYDROcodone-acetaminophen 5-325 MG tablet Commonly known as:  NORCO/VICODIN Take 1-2 tablets by mouth every 4 (four) hours as needed (breakthrough pain).   ibuprofen 200 MG tablet Commonly known as:  ADVIL,MOTRIN Take 200 mg by mouth 2 (two) times daily as  needed for mild pain.   meloxicam 7.5 MG tablet Commonly known as:  MOBIC Take 7.5 mg by mouth 2 (two) times daily as needed for pain.   naproxen sodium 220 MG tablet Commonly known as:  ANAPROX Take 220 mg by mouth 2 (two) times daily as needed (pain).   oxyCODONE 5 MG immediate release tablet Commonly known as:  ROXICODONE Take 1-2 tablets (5-10 mg total) by mouth every 4 (four) hours as needed for moderate pain or severe pain.   pantoprazole 40 MG tablet Commonly known as:  PROTONIX Take 40 mg by mouth daily after breakfast.   simvastatin 40 MG tablet Commonly known as:  ZOCOR Take 40 mg by mouth daily at 6 PM.   SYNTHROID 75 MCG tablet Generic drug:  levothyroxine Take 75 mcg by mouth daily before breakfast.   traMADol 50 MG tablet Commonly known as:  ULTRAM Take 50 mg by mouth daily as needed for moderate pain.        Signed: Asmar Brozek A 04/17/2017, 11:29 AM

## 2017-04-17 NOTE — Discharge Instructions (Signed)

## 2017-04-17 NOTE — Progress Notes (Signed)
Patient is discharged from room 3C08 at this time. Alert and in stable condition. IV site d/c'[d and instructions read to patient and spouse with understanding verbalized. Left unit via wheelchair with all belongings at side. 

## 2018-06-02 NOTE — Pre-Procedure Instructions (Signed)
Charlestine NightGloria Mojica  06/02/2018      [id number] [place], [place] - [address] [facility] [id number] Phone: [phone] Fax: [phone]    Your procedure is scheduled on June 15, 2018.  [facility] at 530 AM.  Call this number if you have problems the morning of surgery:  [phone]   Remember:  Do not eat or drink after midnight.    Take these medicines the morning of surgery with A SIP OF WATER  Tylenol-if needed Hydrocodone-acetaminophen (norco)-if needed Levothyroxine (synthroid) Pantoprazole (protonix)  7 days prior to surgery STOP taking any Aspirin (unless otherwise instructed by your surgeon), Aleve, Naproxen, Ibuprofen, Motrin, Advil, Goody's, BC's, all herbal medications, fish oil, and all vitamins    Do not wear jewelry, make-up or nail polish.  Do not wear lotions, powders, or perfumes, or deodorant.  Do not shave 48 hours prior to surgery.    Do not bring valuables to the hospital.  [facility] is not responsible for any belongings or valuables.  Contacts, dentures or bridgework may not be worn into surgery.  Leave your suitcase in the car.  After surgery it may be brought to your room.  For patients admitted to the hospital, discharge time will be determined by your treatment team.  Patients discharged the day of surgery will not be allowed to drive home.    [facility]- Preparing For Surgery  Before surgery, you can play an important role. Because skin is not sterile, your skin needs to be as free of germs as possible. You can reduce the number of germs on your skin by washing with CHG (chlorahexidine gluconate) Soap before surgery.  CHG is an antiseptic cleaner which kills germs and bonds with the skin to continue killing germs even after washing.    Oral Hygiene is also important to reduce your risk of infection.  Remember - BRUSH YOUR TEETH THE MORNING OF SURGERY WITH YOUR REGULAR  TOOTHPASTE  Please do not use if you have an allergy to CHG or antibacterial soaps. If your skin becomes reddened/irritated stop using the CHG.  Do not shave (including legs and underarms) for at least 48 hours prior to first CHG shower. It is OK to shave your face.  Please follow these instructions carefully.   1. Shower the NIGHT BEFORE SURGERY and the MORNING OF SURGERY with CHG.   2. If you chose to wash your hair, wash your hair first as usual with your normal shampoo.  3. After you shampoo, rinse your hair and body thoroughly to remove the shampoo.  4. Use CHG as you would any other liquid soap. You can apply CHG directly to the skin and wash gently with a scrungie or a clean washcloth.   5. Apply the CHG Soap to your body ONLY FROM THE NECK DOWN.  Do not use on open wounds or open sores. Avoid contact with your eyes, ears, mouth and genitals (private parts). Wash Face and genitals (private parts)  with your normal soap.  6. Wash thoroughly, paying special attention to the area where your surgery will be performed.  7. Thoroughly rinse your body with warm water from the neck down.  8. DO NOT shower/wash with your normal soap after using and rinsing off the CHG Soap.  9. Pat yourself dry with a CLEAN TOWEL.  10. Wear CLEAN PAJAMAS to bed the night before surgery, wear comfortable clothes the morning of surgery  11.  Place CLEAN SHEETS on your bed the night of your first shower and DO NOT SLEEP WITH PETS.  Day of Surgery:  Do not apply any deodorants/lotions.  Please wear clean clothes to the hospital/surgery center.   Remember to brush your teeth WITH YOUR REGULAR TOOTHPASTE.  Please read over the following fact sheets that you were given. Pain Booklet, Coughing and Deep Breathing, MRSA Information and Surgical Site Infection Prevention

## 2018-06-03 ENCOUNTER — Encounter (HOSPITAL_COMMUNITY): Payer: Self-pay

## 2018-06-03 ENCOUNTER — Encounter (HOSPITAL_COMMUNITY)
Admission: RE | Admit: 2018-06-03 | Discharge: 2018-06-03 | Disposition: A | Discharge status: Home / Self Care | Payer: Medicare HMO | Source: Ambulatory Visit | Attending: Orthopedic Surgery | Admitting: Orthopedic Surgery

## 2018-06-03 ENCOUNTER — Other Ambulatory Visit: Payer: Self-pay

## 2018-06-03 DIAGNOSIS — M1711 Unilateral primary osteoarthritis, right knee: Secondary | ICD-10-CM | POA: Insufficient documentation

## 2018-06-03 DIAGNOSIS — Z01818 Encounter for other preprocedural examination: Secondary | ICD-10-CM | POA: Insufficient documentation

## 2018-06-03 LAB — CBC
HEMATOCRIT: 41.8 % (ref 36.0–46.0)
HEMOGLOBIN: 13.5 g/dL (ref 12.0–15.0)
MCH: 30.3 pg (ref 26.0–34.0)
MCHC: 32.3 g/dL (ref 30.0–36.0)
MCV: 93.9 fL (ref 78.0–100.0)
Platelets: 187 10*3/uL (ref 150–400)
RBC: 4.45 MIL/uL (ref 3.87–5.11)
RDW: 12.6 % (ref 11.5–15.5)
WBC: 6.6 10*3/uL (ref 4.0–10.5)

## 2018-06-03 LAB — BASIC METABOLIC PANEL
Anion gap: 7 (ref 5–15)
BUN: 9 mg/dL (ref 8–23)
CHLORIDE: 111 mmol/L (ref 98–111)
CO2: 24 mmol/L (ref 22–32)
Calcium: 9 mg/dL (ref 8.9–10.3)
Creatinine, Ser: 0.82 mg/dL (ref 0.44–1.00)
GFR calc Af Amer: >60 mL/min (ref >60)
GFR calc non Af Amer: >60 mL/min (ref >60)
GLUCOSE: 92 mg/dL (ref 70–99)
POTASSIUM: 4 mmol/L (ref 3.5–5.1)
SODIUM: 142 mmol/L (ref 135–145)

## 2018-06-03 LAB — SURGICAL PCR SCREEN
MRSA, PCR: NEGATIVE
Staphylococcus aureus: NEGATIVE

## 2018-06-03 NOTE — Pre-Procedure Instructions (Signed)
Natalie Sanders  06/03/2018      [id number] [place], [place] - [address] [facility] [place] [id number] Phone: [phone] Fax: [phone]    Your procedure is scheduled on Tuesday June 15, 2018.   [facility] at 530 AM.    Call this number if you have problems the morning of surgery:  [phone]   Remember:   Do not eat or drink after midnight, Monday.    Take these medicines the morning of surgery with A SIP OF WATER  Tylenol-if needed Hydrocodone-acetaminophen (norco)-if needed Levothyroxine (synthroid) Pantoprazole (protonix)  7 days prior to surgery STOP taking any Aspirin (unless otherwise instructed by your surgeon), Aleve, Naproxen, Ibuprofen, Motrin, Advil, Goody's, BC's, all herbal medications, fish oil, and all vitamins    Do not wear jewelry, make-up or nail polish.  Do not wear lotions, powders,  perfumes, or deodorant.  Do not shave 48 hours prior to surgery.    Do not bring valuables to the hospital.  [facility] is not responsible for any belongings or valuables.  Contacts, dentures or bridgework may not be worn into surgery.  Leave your suitcase in the car.  After surgery it may be brought to your room.  For patients admitted to the hospital, discharge time will be determined by your treatment team.  Patients discharged the day of surgery will not be allowed to drive home.    [facility]- Preparing For Surgery  Before surgery, you can play an important role. Because skin is not sterile, your skin needs to be as free of germs as possible. You can reduce the number of germs on your skin by washing with CHG (chlorahexidine gluconate) Soap before surgery.  CHG is an antiseptic cleaner which kills germs and bonds with the skin to continue killing germs even after washing.    Oral Hygiene is also important to reduce your risk of infection.    Remember - BRUSH YOUR TEETH THE MORNING OF  SURGERY WITH YOUR REGULAR TOOTHPASTE  Please do not use if you have an allergy to CHG or antibacterial soaps. If your skin becomes reddened/irritated stop using the CHG.  Do not shave (including legs and underarms) for at least 48 hours prior to first CHG shower. It is OK to shave your face.  Please follow these instructions carefully.   1. Shower the NIGHT BEFORE SURGERY and the MORNING OF SURGERY with CHG.   2. If you chose to wash your hair, wash your hair first as usual with your normal shampoo.  3. After you shampoo, rinse your hair and body thoroughly to remove the shampoo.  4. Use CHG as you would any other liquid soap. You can apply CHG directly to the skin and wash gently with a scrungie or a clean washcloth.   5. Apply the CHG Soap to your body ONLY FROM THE NECK DOWN.  Do not use on open wounds or open sores. Avoid contact with your eyes, ears, mouth and genitals (private parts). Wash Face and genitals (private parts)  with your normal soap.  6. Wash thoroughly, paying special attention to the area where your surgery will be performed.  7. Thoroughly rinse your body with warm water from the neck down.  8. DO NOT shower/wash with your normal soap after using and rinsing off the CHG Soap.  9. Pat yourself dry with a CLEAN TOWEL.  10. Wear CLEAN PAJAMAS to bed the night before surgery, wear  comfortable clothes the morning of surgery  11. Place CLEAN SHEETS on your bed the night of your first shower and DO NOT SLEEP WITH PETS.  Day of Surgery:  Do not apply any deodorants/lotions.  Please wear clean clothes to the hospital/surgery center.   Remember to brush your teeth WITH YOUR REGULAR TOOTHPASTE.  Please read over the following fact sheets that you were given. Pain Booklet, Coughing and Deep Breathing, MRSA Information and Surgical Site Infection Prevention

## 2018-06-15 ENCOUNTER — Encounter (HOSPITAL_COMMUNITY)
Admission: RE | Disposition: A | Discharge status: Home Health | Payer: Self-pay | Source: Home / Self Care | Attending: Orthopedic Surgery

## 2018-06-15 ENCOUNTER — Inpatient Hospital Stay (HOSPITAL_COMMUNITY): Payer: Medicare HMO

## 2018-06-15 ENCOUNTER — Inpatient Hospital Stay (HOSPITAL_COMMUNITY): Payer: Medicare HMO | Admitting: Anesthesiology

## 2018-06-15 ENCOUNTER — Other Ambulatory Visit: Payer: Self-pay

## 2018-06-15 ENCOUNTER — Encounter (HOSPITAL_COMMUNITY): Payer: Self-pay | Admitting: *Deleted

## 2018-06-15 ENCOUNTER — Inpatient Hospital Stay (HOSPITAL_COMMUNITY)
Admission: RE | Admit: 2018-06-15 | Discharge: 2018-06-17 | DRG: 470 | Disposition: A | Discharge status: Home Health | Payer: Medicare HMO | Attending: Orthopedic Surgery | Admitting: Orthopedic Surgery

## 2018-06-15 DIAGNOSIS — Z9049 Acquired absence of other specified parts of digestive tract: Secondary | ICD-10-CM | POA: Diagnosis not present

## 2018-06-15 DIAGNOSIS — Z6839 Body mass index (BMI) 39.0-39.9, adult: Secondary | ICD-10-CM

## 2018-06-15 DIAGNOSIS — Z79899 Other long term (current) drug therapy: Secondary | ICD-10-CM

## 2018-06-15 DIAGNOSIS — E039 Hypothyroidism, unspecified: Secondary | ICD-10-CM | POA: Diagnosis present

## 2018-06-15 DIAGNOSIS — Z888 Allergy status to other drugs, medicaments and biological substances status: Secondary | ICD-10-CM | POA: Diagnosis not present

## 2018-06-15 DIAGNOSIS — M25761 Osteophyte, right knee: Secondary | ICD-10-CM | POA: Diagnosis present

## 2018-06-15 DIAGNOSIS — Z79891 Long term (current) use of opiate analgesic: Secondary | ICD-10-CM

## 2018-06-15 DIAGNOSIS — Z96651 Presence of right artificial knee joint: Secondary | ICD-10-CM

## 2018-06-15 DIAGNOSIS — M1711 Unilateral primary osteoarthritis, right knee: Principal | ICD-10-CM | POA: Diagnosis present

## 2018-06-15 DIAGNOSIS — Z87891 Personal history of nicotine dependence: Secondary | ICD-10-CM

## 2018-06-15 DIAGNOSIS — Z79890 Hormone replacement therapy (postmenopausal): Secondary | ICD-10-CM | POA: Diagnosis not present

## 2018-06-15 DIAGNOSIS — E7800 Pure hypercholesterolemia, unspecified: Secondary | ICD-10-CM | POA: Diagnosis present

## 2018-06-15 DIAGNOSIS — Z972 Presence of dental prosthetic device (complete) (partial): Secondary | ICD-10-CM

## 2018-06-15 DIAGNOSIS — Z791 Long term (current) use of non-steroidal anti-inflammatories (NSAID): Secondary | ICD-10-CM

## 2018-06-15 DIAGNOSIS — Z96659 Presence of unspecified artificial knee joint: Secondary | ICD-10-CM

## 2018-06-15 DIAGNOSIS — K219 Gastro-esophageal reflux disease without esophagitis: Secondary | ICD-10-CM | POA: Diagnosis present

## 2018-06-15 HISTORY — PX: TOTAL KNEE ARTHROPLASTY: SHX125

## 2018-06-15 HISTORY — DX: Unilateral primary osteoarthritis, right knee: M17.11

## 2018-06-15 SURGERY — ARTHROPLASTY, KNEE, TOTAL
Anesthesia: Spinal | Site: Knee | Laterality: Right

## 2018-06-15 MED ORDER — ONDANSETRON HCL 4 MG PO TABS: 4.0000 mg | ORAL_TABLET | Freq: Three times a day (TID) | ORAL | 0 refills | Status: AC | PRN

## 2018-06-15 MED ORDER — MEPERIDINE HCL 50 MG/ML IJ SOLN: 6.2500 mg | INTRAMUSCULAR | Status: DC | PRN

## 2018-06-15 MED ORDER — KETOROLAC TROMETHAMINE 30 MG/ML IJ SOLN
INTRAMUSCULAR | Status: AC
  Filled 2018-06-15: qty 1

## 2018-06-15 MED ORDER — POTASSIUM CHLORIDE IN NACL 20-0.45 MEQ/L-% IV SOLN
INTRAVENOUS | Status: DC
  Administered 2018-06-15 – 2018-06-16 (×2): via INTRAVENOUS
  Filled 2018-06-15 (×3): qty 1000

## 2018-06-15 MED ORDER — PROPOFOL 10 MG/ML IV BOLUS
INTRAVENOUS | Status: AC
  Filled 2018-06-15: qty 20

## 2018-06-15 MED ORDER — MIDAZOLAM HCL 2 MG/2ML IJ SOLN
INTRAMUSCULAR | Status: AC
  Filled 2018-06-15: qty 2

## 2018-06-15 MED ORDER — CEFAZOLIN SODIUM-DEXTROSE 2-4 GM/100ML-% IV SOLN
2.0000 g | INTRAVENOUS | Status: AC
  Administered 2018-06-15: 2 g via INTRAVENOUS
  Filled 2018-06-15: qty 100

## 2018-06-15 MED ORDER — STERILE WATER FOR IRRIGATION IR SOLN
Status: DC | PRN
  Administered 2018-06-15 (×2): 1000 mL

## 2018-06-15 MED ORDER — GLYCOPYRROLATE 0.2 MG/ML IJ SOLN
INTRAMUSCULAR | Status: DC | PRN
  Administered 2018-06-15: 0.2 mg via INTRAVENOUS

## 2018-06-15 MED ORDER — DIPHENHYDRAMINE HCL 12.5 MG/5ML PO ELIX: 12.5000 mg | ORAL_SOLUTION | ORAL | Status: DC | PRN

## 2018-06-15 MED ORDER — FENTANYL CITRATE (PF) 100 MCG/2ML IJ SOLN
INTRAMUSCULAR | Status: AC
  Filled 2018-06-15: qty 2

## 2018-06-15 MED ORDER — POLYETHYLENE GLYCOL 3350 17 G PO PACK: 17.0000 g | PACK | Freq: Every day | ORAL | Status: DC | PRN

## 2018-06-15 MED ORDER — OXYCODONE HCL 5 MG PO TABS
5.0000 mg | ORAL_TABLET | ORAL | Status: DC | PRN
  Administered 2018-06-15 – 2018-06-16 (×2): 10 mg via ORAL
  Administered 2018-06-16: 5 mg via ORAL
  Administered 2018-06-16: 10 mg via ORAL
  Administered 2018-06-16: 5 mg via ORAL
  Administered 2018-06-17: 10 mg via ORAL
  Filled 2018-06-15: qty 1
  Filled 2018-06-15 (×4): qty 2
  Filled 2018-06-15: qty 1

## 2018-06-15 MED ORDER — OXYCODONE HCL 5 MG PO TABS
10.0000 mg | ORAL_TABLET | ORAL | Status: DC | PRN
  Administered 2018-06-15: 15 mg via ORAL
  Administered 2018-06-16 (×2): 10 mg via ORAL
  Administered 2018-06-17 (×3): 15 mg via ORAL
  Filled 2018-06-15 (×5): qty 3
  Filled 2018-06-15: qty 2

## 2018-06-15 MED ORDER — METOCLOPRAMIDE HCL 5 MG/ML IJ SOLN: 10.0000 mg | Freq: Once | INTRAMUSCULAR | Status: DC | PRN

## 2018-06-15 MED ORDER — OXYCODONE HCL 5 MG PO TABS: 5.0000 mg | ORAL_TABLET | ORAL | 0 refills | Status: DC | PRN

## 2018-06-15 MED ORDER — SENNA-DOCUSATE SODIUM 8.6-50 MG PO TABS: 2.0000 | ORAL_TABLET | Freq: Every day | ORAL | 1 refills | Status: DC

## 2018-06-15 MED ORDER — ASPIRIN EC 325 MG PO TBEC: 325.0000 mg | DELAYED_RELEASE_TABLET | Freq: Every day | ORAL | 0 refills | Status: DC

## 2018-06-15 MED ORDER — GLYCOPYRROLATE PF 0.2 MG/ML IJ SOSY
PREFILLED_SYRINGE | INTRAMUSCULAR | Status: AC
  Filled 2018-06-15: qty 1

## 2018-06-15 MED ORDER — ZOLPIDEM TARTRATE 5 MG PO TABS: 5.0000 mg | ORAL_TABLET | Freq: Every evening | ORAL | Status: DC | PRN

## 2018-06-15 MED ORDER — ONDANSETRON HCL 4 MG/2ML IJ SOLN
INTRAMUSCULAR | Status: DC | PRN
  Administered 2018-06-15: 4 mg via INTRAVENOUS

## 2018-06-15 MED ORDER — CLONIDINE HCL (ANALGESIA) 100 MCG/ML EP SOLN
EPIDURAL | Status: DC | PRN
  Administered 2018-06-15: 100 ug

## 2018-06-15 MED ORDER — METHOCARBAMOL 500 MG PO TABS
500.0000 mg | ORAL_TABLET | Freq: Four times a day (QID) | ORAL | Status: DC | PRN
  Filled 2018-06-15 (×2): qty 1

## 2018-06-15 MED ORDER — PHENYLEPHRINE 40 MCG/ML (10ML) SYRINGE FOR IV PUSH (FOR BLOOD PRESSURE SUPPORT)
PREFILLED_SYRINGE | INTRAVENOUS | Status: AC
  Filled 2018-06-15: qty 10

## 2018-06-15 MED ORDER — PHENYLEPHRINE HCL 10 MG/ML IJ SOLN
INTRAVENOUS | Status: DC | PRN
  Administered 2018-06-15: 40 ug/min via INTRAVENOUS

## 2018-06-15 MED ORDER — OPTI-FREE EXPRESS REWETTING SOLN: 1.0000 [drp] | Freq: Every day | Status: DC | PRN

## 2018-06-15 MED ORDER — PROPOFOL 10 MG/ML IV BOLUS
INTRAVENOUS | Status: DC | PRN
  Administered 2018-06-15: 15 mg via INTRAVENOUS
  Administered 2018-06-15 (×2): 10 mg via INTRAVENOUS

## 2018-06-15 MED ORDER — METHOCARBAMOL 1000 MG/10ML IJ SOLN
500.0000 mg | Freq: Four times a day (QID) | INTRAVENOUS | Status: DC | PRN
  Administered 2018-06-15 – 2018-06-17 (×6): 500 mg via INTRAVENOUS
  Filled 2018-06-15 (×6): qty 550

## 2018-06-15 MED ORDER — DEXAMETHASONE SODIUM PHOSPHATE 10 MG/ML IJ SOLN
10.0000 mg | Freq: Once | INTRAMUSCULAR | Status: AC
  Administered 2018-06-16: 10 mg via INTRAVENOUS
  Filled 2018-06-15: qty 1

## 2018-06-15 MED ORDER — ONDANSETRON HCL 4 MG PO TABS
4.0000 mg | ORAL_TABLET | Freq: Four times a day (QID) | ORAL | Status: DC | PRN
  Administered 2018-06-16: 4 mg via ORAL
  Filled 2018-06-15: qty 1

## 2018-06-15 MED ORDER — LACTATED RINGERS IV SOLN
INTRAVENOUS | Status: DC
  Administered 2018-06-15 (×2): via INTRAVENOUS

## 2018-06-15 MED ORDER — BUPIVACAINE HCL (PF) 0.25 % IJ SOLN
INTRAMUSCULAR | Status: AC
  Filled 2018-06-15: qty 30

## 2018-06-15 MED ORDER — ACETAMINOPHEN 500 MG PO TABS: 1000.0000 mg | ORAL_TABLET | Freq: Two times a day (BID) | ORAL | Status: DC | PRN

## 2018-06-15 MED ORDER — KETOROLAC TROMETHAMINE 30 MG/ML IJ SOLN
INTRAMUSCULAR | Status: DC | PRN
  Administered 2018-06-15: 30 mg

## 2018-06-15 MED ORDER — LIDOCAINE 2% (20 MG/ML) 5 ML SYRINGE
INTRAMUSCULAR | Status: AC
  Filled 2018-06-15: qty 5

## 2018-06-15 MED ORDER — LACTATED RINGERS IV SOLN: INTRAVENOUS | Status: DC

## 2018-06-15 MED ORDER — LEVOTHYROXINE SODIUM 75 MCG PO TABS
75.0000 ug | ORAL_TABLET | Freq: Every day | ORAL | Status: DC
  Administered 2018-06-16 – 2018-06-17 (×2): 75 ug via ORAL
  Filled 2018-06-15 (×2): qty 1

## 2018-06-15 MED ORDER — ONDANSETRON HCL 4 MG/2ML IJ SOLN
INTRAMUSCULAR | Status: AC
  Filled 2018-06-15: qty 2

## 2018-06-15 MED ORDER — ASPIRIN EC 325 MG PO TBEC
325.0000 mg | DELAYED_RELEASE_TABLET | Freq: Two times a day (BID) | ORAL | Status: DC
  Administered 2018-06-16 – 2018-06-17 (×4): 325 mg via ORAL
  Filled 2018-06-15 (×4): qty 1

## 2018-06-15 MED ORDER — METOCLOPRAMIDE HCL 5 MG PO TABS: 5.0000 mg | ORAL_TABLET | Freq: Three times a day (TID) | ORAL | Status: DC | PRN

## 2018-06-15 MED ORDER — HYDROMORPHONE HCL 1 MG/ML IJ SOLN
0.5000 mg | INTRAMUSCULAR | Status: DC | PRN
  Administered 2018-06-15 – 2018-06-16 (×4): 1 mg via INTRAVENOUS
  Filled 2018-06-15 (×4): qty 1

## 2018-06-15 MED ORDER — MAGNESIUM CITRATE PO SOLN: 1.0000 | Freq: Once | ORAL | Status: DC | PRN

## 2018-06-15 MED ORDER — ALUM & MAG HYDROXIDE-SIMETH 200-200-20 MG/5ML PO SUSP: 30.0000 mL | ORAL | Status: DC | PRN

## 2018-06-15 MED ORDER — PHENYLEPHRINE 40 MCG/ML (10ML) SYRINGE FOR IV PUSH (FOR BLOOD PRESSURE SUPPORT)
PREFILLED_SYRINGE | INTRAVENOUS | Status: DC | PRN
  Administered 2018-06-15: 80 ug via INTRAVENOUS

## 2018-06-15 MED ORDER — FENTANYL CITRATE (PF) 100 MCG/2ML IJ SOLN
INTRAMUSCULAR | Status: DC | PRN
  Administered 2018-06-15: 50 ug via INTRAVENOUS
  Administered 2018-06-15: 50 ug via INTRATHECAL

## 2018-06-15 MED ORDER — PHENYLEPHRINE HCL 10 MG/ML IJ SOLN
INTRAMUSCULAR | Status: AC
  Filled 2018-06-15: qty 1

## 2018-06-15 MED ORDER — METOCLOPRAMIDE HCL 5 MG/ML IJ SOLN
5.0000 mg | Freq: Three times a day (TID) | INTRAMUSCULAR | Status: DC | PRN
  Administered 2018-06-15: 10 mg via INTRAVENOUS
  Filled 2018-06-15: qty 2

## 2018-06-15 MED ORDER — BUPIVACAINE IN DEXTROSE 0.75-8.25 % IT SOLN
INTRATHECAL | Status: DC | PRN
  Administered 2018-06-15: 2 mL via INTRATHECAL

## 2018-06-15 MED ORDER — FENTANYL CITRATE (PF) 100 MCG/2ML IJ SOLN: 25.0000 ug | INTRAMUSCULAR | Status: DC | PRN

## 2018-06-15 MED ORDER — SODIUM CHLORIDE 0.9 % IR SOLN
Status: DC | PRN
  Administered 2018-06-15: 1000 mL

## 2018-06-15 MED ORDER — MIDAZOLAM HCL 2 MG/2ML IJ SOLN
INTRAMUSCULAR | Status: DC | PRN
  Administered 2018-06-15 (×2): 1 mg via INTRAVENOUS

## 2018-06-15 MED ORDER — BISACODYL 10 MG RE SUPP: 10.0000 mg | Freq: Every day | RECTAL | Status: DC | PRN

## 2018-06-15 MED ORDER — SIMVASTATIN 40 MG PO TABS
40.0000 mg | ORAL_TABLET | Freq: Every day | ORAL | Status: DC
  Administered 2018-06-16: 40 mg via ORAL
  Filled 2018-06-15 (×2): qty 1

## 2018-06-15 MED ORDER — LIDOCAINE 2% (20 MG/ML) 5 ML SYRINGE
INTRAMUSCULAR | Status: DC | PRN
  Administered 2018-06-15: 40 mg via INTRAVENOUS

## 2018-06-15 MED ORDER — ACETAMINOPHEN 500 MG PO TABS
1000.0000 mg | ORAL_TABLET | Freq: Four times a day (QID) | ORAL | Status: AC
  Administered 2018-06-15 – 2018-06-16 (×4): 1000 mg via ORAL
  Filled 2018-06-15 (×3): qty 2

## 2018-06-15 MED ORDER — MENTHOL 3 MG MT LOZG: 1.0000 | LOZENGE | OROMUCOSAL | Status: DC | PRN

## 2018-06-15 MED ORDER — BUPIVACAINE HCL (PF) 0.5 % IJ SOLN
INTRAMUSCULAR | Status: DC | PRN
  Administered 2018-06-15: 30 mL

## 2018-06-15 MED ORDER — DOCUSATE SODIUM 100 MG PO CAPS
100.0000 mg | ORAL_CAPSULE | Freq: Two times a day (BID) | ORAL | Status: DC
  Administered 2018-06-16 – 2018-06-17 (×4): 100 mg via ORAL
  Filled 2018-06-15 (×4): qty 1

## 2018-06-15 MED ORDER — BUPIVACAINE HCL (PF) 0.25 % IJ SOLN
INTRAMUSCULAR | Status: DC | PRN
  Administered 2018-06-15: 30 mL

## 2018-06-15 MED ORDER — PROPOFOL 500 MG/50ML IV EMUL
INTRAVENOUS | Status: DC | PRN
  Administered 2018-06-15: 50 ug/kg/min via INTRAVENOUS

## 2018-06-15 MED ORDER — BACLOFEN 10 MG PO TABS: 10.0000 mg | ORAL_TABLET | Freq: Three times a day (TID) | ORAL | 0 refills | Status: DC

## 2018-06-15 MED ORDER — CEFAZOLIN SODIUM-DEXTROSE 2-4 GM/100ML-% IV SOLN
2.0000 g | Freq: Four times a day (QID) | INTRAVENOUS | Status: AC
  Administered 2018-06-15 (×2): 2 g via INTRAVENOUS
  Filled 2018-06-15 (×2): qty 100

## 2018-06-15 MED ORDER — PANTOPRAZOLE SODIUM 40 MG PO TBEC
40.0000 mg | DELAYED_RELEASE_TABLET | Freq: Every evening | ORAL | Status: DC
  Administered 2018-06-15 – 2018-06-16 (×2): 40 mg via ORAL
  Filled 2018-06-15 (×2): qty 1

## 2018-06-15 MED ORDER — TRANEXAMIC ACID 1000 MG/10ML IV SOLN
1000.0000 mg | Freq: Once | INTRAVENOUS | Status: AC
  Administered 2018-06-15: 1000 mg via INTRAVENOUS
  Filled 2018-06-15: qty 1100

## 2018-06-15 MED ORDER — ONDANSETRON HCL 4 MG/2ML IJ SOLN
4.0000 mg | Freq: Four times a day (QID) | INTRAMUSCULAR | Status: DC | PRN
  Administered 2018-06-15 – 2018-06-16 (×2): 4 mg via INTRAVENOUS
  Filled 2018-06-15 (×2): qty 2

## 2018-06-15 MED ORDER — ACETAMINOPHEN 325 MG PO TABS
325.0000 mg | ORAL_TABLET | Freq: Four times a day (QID) | ORAL | Status: DC | PRN
Start: 2018-06-16 — End: 2018-06-17

## 2018-06-15 MED ORDER — PHENOL 1.4 % MT LIQD
1.0000 | OROMUCOSAL | Status: DC | PRN
  Filled 2018-06-15: qty 177

## 2018-06-15 MED ORDER — CHLORHEXIDINE GLUCONATE 4 % EX LIQD: 60.0000 mL | Freq: Once | CUTANEOUS | Status: DC

## 2018-06-15 SURGICAL SUPPLY — 67 items
BAG ZIPLOCK 12X15 (MISCELLANEOUS) IMPLANT
BANDAGE ELASTIC 6 VELCRO ST LF (GAUZE/BANDAGES/DRESSINGS) ×3 IMPLANT
BEARING TIBIAL PS 10X71/75 (Joint) ×2 IMPLANT
BEARING TIBIAL PS 10X71/75MM (Joint) ×1 IMPLANT
BENZOIN TINCTURE PRP APPL 2/3 (GAUZE/BANDAGES/DRESSINGS) ×3 IMPLANT
BIT DRILL QUICK REL 1/8 2PK SL (DRILL) ×1 IMPLANT
BLADE SAG 18X100X1.27 (BLADE) ×3 IMPLANT
BLADE SAW SGTL 13.0X1.19X90.0M (BLADE) ×3 IMPLANT
BNDG ELASTIC 6X15 VLCR STRL LF (GAUZE/BANDAGES/DRESSINGS) ×3 IMPLANT
BOWL SMART MIX CTS (DISPOSABLE) ×3 IMPLANT
CEMENT HV SMART SET (Cement) ×6 IMPLANT
CLOSURE STERI-STRIP 1/2X4 (GAUZE/BANDAGES/DRESSINGS) ×1
CLSR STERI-STRIP ANTIMIC 1/2X4 (GAUZE/BANDAGES/DRESSINGS) ×2 IMPLANT
COMP FEM VG IL 70 RT (Knees) ×3 IMPLANT
COMPONENT FEM VG IL 70 RT (Knees) ×1 IMPLANT
COVER SURGICAL LIGHT HANDLE (MISCELLANEOUS) ×3 IMPLANT
CUFF TOURN SGL QUICK 34 (TOURNIQUET CUFF) ×2
CUFF TRNQT CYL 34X4X40X1 (TOURNIQUET CUFF) ×1 IMPLANT
DECANTER SPIKE VIAL GLASS SM (MISCELLANEOUS) IMPLANT
DISTAL FEMORAL PEG (Knees) ×3 IMPLANT
DRAPE U-SHAPE 47X51 STRL (DRAPES) ×3 IMPLANT
DRILL QUICK RELEASE 1/8 INCH (DRILL) ×2
DRSG ADAPTIC 3X8 NADH LF (GAUZE/BANDAGES/DRESSINGS) ×3 IMPLANT
DRSG PAD ABDOMINAL 8X10 ST (GAUZE/BANDAGES/DRESSINGS) ×3 IMPLANT
ELECT REM PT RETURN 15FT ADLT (MISCELLANEOUS) ×3 IMPLANT
EVACUATOR 1/8 PVC DRAIN (DRAIN) IMPLANT
GAUZE SPONGE 4X4 12PLY STRL (GAUZE/BANDAGES/DRESSINGS) ×3 IMPLANT
GAUZE SPONGE 4X4 16PLY XRAY LF (GAUZE/BANDAGES/DRESSINGS) ×3 IMPLANT
GLOVE BIOGEL PI IND STRL 7.5 (GLOVE) ×2 IMPLANT
GLOVE BIOGEL PI IND STRL 8 (GLOVE) ×2 IMPLANT
GLOVE BIOGEL PI INDICATOR 7.5 (GLOVE) ×4
GLOVE BIOGEL PI INDICATOR 8 (GLOVE) ×4
GLOVE ORTHO TXT STRL SZ7.5 (GLOVE) ×6 IMPLANT
GLOVE SURG ORTHO 8.0 STRL STRW (GLOVE) ×9 IMPLANT
GOWN STRL REUS W/TWL LRG LVL3 (GOWN DISPOSABLE) ×3 IMPLANT
GOWN STRL REUS W/TWL XL LVL3 (GOWN DISPOSABLE) ×12 IMPLANT
HANDPIECE INTERPULSE COAX TIP (DISPOSABLE) ×2
HOLDER FOLEY CATH W/STRAP (MISCELLANEOUS) IMPLANT
HOOD PEEL AWAY FLYTE STAYCOOL (MISCELLANEOUS) ×9 IMPLANT
IMMOBILIZER KNEE 16 UNIV (MISCELLANEOUS) ×3 IMPLANT
IMMOBILIZER KNEE 20 (SOFTGOODS)
IMMOBILIZER KNEE 20 THIGH 36 (SOFTGOODS) IMPLANT
IMMOBILIZER KNEE 22 UNIV (SOFTGOODS) ×3 IMPLANT
MANIFOLD NEPTUNE II (INSTRUMENTS) ×3 IMPLANT
NDL SAFETY ECLIPSE 18X1.5 (NEEDLE) IMPLANT
NEEDLE HYPO 18GX1.5 SHARP (NEEDLE)
NS IRRIG 1000ML POUR BTL (IV SOLUTION) ×3 IMPLANT
PACK ICE MAXI GEL EZY WRAP (MISCELLANEOUS) ×3 IMPLANT
PACK TOTAL KNEE CUSTOM (KITS) ×3 IMPLANT
PAD CAST 4YDX4 CTTN HI CHSV (CAST SUPPLIES) ×2 IMPLANT
PADDING CAST COTTON 4X4 STRL (CAST SUPPLIES) ×4
PADDING CAST COTTON 6X4 STRL (CAST SUPPLIES) ×3 IMPLANT
PEG FEMORAL DISTAL (Knees) ×1 IMPLANT
PEG PATELLA SERIES A 37MMX10MM (Orthopedic Implant) ×3 IMPLANT
PLATE KNEE TIBIAL 71MM FIXED (Plate) ×3 IMPLANT
POSITIONER SURGICAL ARM (MISCELLANEOUS) ×3 IMPLANT
SET HNDPC FAN SPRY TIP SCT (DISPOSABLE) ×1 IMPLANT
STAPLER VISISTAT 35W (STAPLE) IMPLANT
SUT VIC AB 0 CT1 36 (SUTURE) ×9 IMPLANT
SUT VIC AB 2-0 SH 27 (SUTURE) ×2
SUT VIC AB 2-0 SH 27X BRD (SUTURE) ×1 IMPLANT
SUT VIC AB 3-0 SH 18 (SUTURE) ×3 IMPLANT
SUT VIC AB 4-0 PS2 18 (SUTURE) ×3 IMPLANT
SYR 3ML LL SCALE MARK (SYRINGE) IMPLANT
TAPE STRIPS DRAPE STRL (GAUZE/BANDAGES/DRESSINGS) ×3 IMPLANT
TRAY FOLEY MTR SLVR 16FR STAT (SET/KITS/TRAYS/PACK) ×3 IMPLANT
WATER STERILE IRR 1000ML POUR (IV SOLUTION) ×6 IMPLANT

## 2018-06-15 NOTE — Anesthesia Procedure Notes (Signed)
Spinal  Patient location during procedure: OR Start time: 06/15/2018 7:40 AM End time: 06/15/2018 7:44 AM Staffing Resident/CRNA: Yolonda Kidaarver, Anibal Quinby L, CRNA Performed: resident/CRNA  Preanesthetic Checklist Completed: patient identified, site marked, surgical consent, pre-op evaluation, timeout performed, IV checked, risks and benefits discussed and monitors and equipment checked Spinal Block Patient position: sitting Prep: ChloraPrep Patient monitoring: heart rate, continuous pulse ox and blood pressure Approach: midline Location: L3-4 Injection technique: single-shot Needle Needle type: Pencan  Needle gauge: 24 G Assessment Sensory level: T4

## 2018-06-15 NOTE — Anesthesia Procedure Notes (Signed)
Anesthesia Regional Block: Adductor canal block   Pre-Anesthetic Checklist: ,, timeout performed, Correct Patient, Correct Site, Correct Laterality, Correct Procedure, Correct Position, site marked, Risks and benefits discussed,  Surgical consent,  Pre-op evaluation,  At surgeon's request and post-op pain management  Laterality: Right and Lower  Prep: Maximum Sterile Barrier Precautions used, chloraprep       Needles:  Injection technique: Single-shot  Needle Type: Echogenic Stimulator Needle     Needle Length: 10cm      Additional Needles:   Procedures:,,,, ultrasound used (permanent image in chart),,,,  Narrative:  Start time: 06/15/2018 7:09 AM End time: 06/15/2018 7:14 AM Injection made incrementally with aspirations every 5 mL.  Performed by: Personally  Anesthesiologist: Phillips Groutarignan, Amaiyah Nordhoff, MD  Additional Notes: Risks, benefits and alternative to block explained extensively.  Patient tolerated procedure well, without complications.

## 2018-06-15 NOTE — Op Note (Signed)
DATE OF SURGERY:  06/15/2018 TIME: 9:55 AM  PATIENT NAME:  Natalie Sanders   AGE: 67 y.o.    PRE-OPERATIVE DIAGNOSIS: Right knee primary localized osteoarthritis  POST-OPERATIVE DIAGNOSIS:  Same  PROCEDURE:  Total Knee Arthroplasty  SURGEON:  Eulas PostJoshua P Javohn Basey, MD   ASSISTANT:  Janace LittenBrandon Parry, OPA-C, present and scrubbed throughout the case, critical for assistance with exposure, retraction, instrumentation, and closure.  Anesthesia: Spinal with abductor canal block and intra-articular injection of Marcaine and Toradol  OPERATIVE IMPLANTS: Biomet Vanguard Fixed Bearing Posterior Stabilized Femur size 70, Tibia size 71, Patella size 37 3-peg oval button, with a 10 mm polyethylene insert.   PREOPERATIVE INDICATIONS:  Natalie Sanders is a 67 y.o. year old female with end stage bone on bone degenerative arthritis of the knee who failed conservative treatment, including injections, antiinflammatories, activity modification, and assistive devices, and had significant impairment of their activities of daily living, and elected for Total Knee Arthroplasty.   The risks, benefits, and alternatives were discussed at length including but not limited to the risks of infection, bleeding, nerve injury, stiffness, blood clots, the need for revision surgery, cardiopulmonary complications, among others, and they were willing to proceed.  OPERATIVE FINDINGS AND UNIQUE ASPECTS OF THE CASE: There was extensive grade 4 chondral loss on both the medial and lateral compartment.  The ACL was also somewhat disrupted.  PCL was intact and sacrificed.  The patella femoral joint was actually relatively preserved.  Initially, use of the 3 degree external rotation jig appeared to be more externally rotated than I wanted, and I repeat in the jig manually to have appropriate rotation.  This provided a parallel posterior cut to the tibia, and appropriate resection of the posterior condyles.  ESTIMATED BLOOD LOSS: 150  mL  OPERATIVE DESCRIPTION:  The patient was brought to the operative room and placed in a supine position.  Anesthesia was administered.  IV antibiotics were given.  The lower extremity was prepped and draped in the usual sterile fashion.  Time out was performed.  The leg was elevated and exsanguinated and the tourniquet was inflated.  Anterior quadriceps tendon splitting approach was performed.  The patella was everted and osteophytes were removed.  The anterior horn of the medial and lateral meniscus was removed.   The distal femur was opened with the drill and the intramedullary distal femoral cutting jig was utilized, set at 5 degrees resecting 9 mm off the distal femur.  Care was taken to protect the collateral ligaments.  Then the extramedullary tibial cutting jig was utilized making the appropriate cut using the anterior tibial crest as a reference building in appropriate posterior slope.  Care was taken during the cut to protect the medial and collateral ligaments.  The proximal tibia was removed along with the posterior horns of the menisci.  The PCL was sacrificed.    The extensor gap was measured and was approximately 10mm.    The distal femoral sizing jig was applied, taking care to avoid notching.  Then the 4-in-1 cutting jig was applied and the anterior and posterior femur was cut, along with the chamfer cuts.  All posterior osteophytes were removed.  The flexion gap was then measured and was symmetric with the extension gap.  I completed the distal femoral preparation using the appropriate jig to prepare the box.  The patella was then measured, and cut with the saw.  The thickness before the cut was 23 and after the cut was 14.  The proximal  tibia sized and prepared accordingly with the reamer and the punch, and then all components were trialed with the 10mm poly insert.  The knee was found to have excellent balance and full motion.    The above named components were then  cemented into place and all excess cement was removed.  The real polyethylene implant was placed.  After the cement had cured I released the tourniquet and confirmed excellent hemostasis with no major posterior vessel injury.    The knee was easily taken through a range of motion and the patella tracked well and the knee irrigated copiously and the parapatellar and subcutaneous tissue closed with vicryl, and monocryl with steri strips for the skin.  The wounds were injected with marcaine, and dressed with sterile gauze and the patient was awakened and returned to the PACU in stable and satisfactory condition.  There were no complications.  Total tourniquet time was 72 minutes.

## 2018-06-15 NOTE — Discharge Instructions (Signed)

## 2018-06-15 NOTE — Anesthesia Preprocedure Evaluation (Addendum)
Anesthesia Evaluation  Patient identified by MRN, date of birth, ID band Patient awake    Reviewed: Allergy & Precautions, NPO status , Patient's Chart, lab work & pertinent test results  Airway Mallampati: II  TM Distance: >3 FB Neck ROM: Full    Dental no notable dental hx. (+) Edentulous Upper, Edentulous Lower   Pulmonary former smoker,    Pulmonary exam normal breath sounds clear to auscultation       Cardiovascular negative cardio ROS Normal cardiovascular exam Rhythm:Regular Rate:Normal     Neuro/Psych negative neurological ROS  negative psych ROS   GI/Hepatic negative GI ROS, Neg liver ROS,   Endo/Other  Morbid obesity  Renal/GU negative Renal ROS  negative genitourinary   Musculoskeletal negative musculoskeletal ROS (+)   Abdominal   Peds negative pediatric ROS (+)  Hematology negative hematology ROS (+)   Anesthesia Other Findings   Reproductive/Obstetrics negative OB ROS                            Anesthesia Physical Anesthesia Plan  ASA: III  Anesthesia Plan: Spinal   Post-op Pain Management:  Regional for Post-op pain   Induction: Intravenous  PONV Risk Score and Plan: 2 and Ondansetron and Treatment may vary due to age or medical condition  Airway Management Planned: Simple Face Mask  Additional Equipment:   Intra-op Plan:   Post-operative Plan:   Informed Consent: I have reviewed the patients History and Physical, chart, labs and discussed the procedure including the risks, benefits and alternatives for the proposed anesthesia with the patient or authorized representative who has indicated his/her understanding and acceptance.   Dental advisory given  Plan Discussed with: CRNA  Anesthesia Plan Comments:        Anesthesia Quick Evaluation

## 2018-06-15 NOTE — Evaluation (Signed)
Physical Therapy Evaluation Patient Details Name: Natalie Sanders MRN: [id number] DOB: June 06, 1951 Today's Date: 06/15/2018   History of Present Illness  R TKA  Clinical Impression  Pt is s/p TKA resulting in the deficits listed below (see PT Problem List).  Pt RN requesting pt to use BSC as she was d/t void; pt also felt she had to void and [facility] left pt with family at bedside; pt unable to void, nursing staff made aware;   Pt will benefit from skilled PT to increase their independence and safety with mobility to allow discharge to the venue listed below.      Follow Up Recommendations Follow surgeon's recommendation for DC plan and follow-up therapies    Equipment Recommendations  Rolling walker with 5" wheels;3in1 (PT)    Recommendations for Other Services       Precautions / Restrictions Precautions Precautions: Knee;Fall Required Braces or Orthoses: Knee Immobilizer - Right Restrictions Weight Bearing Restrictions: No Other Position/Activity Restrictions: WBAT      Mobility  Bed Mobility Overal bed mobility: Needs Assistance Bed Mobility: Supine to Sit     Supine to sit: Min assist     General bed mobility comments: assist with RLE  Transfers Overall transfer level: Needs assistance Equipment used: Rolling walker (2 wheeled) Transfers: Sit to/from [employer] CorporationStand;Stand Pivot Transfers Sit to Stand: Min assist Stand pivot transfers: Min assist       General transfer comment: cues for hand placement and RLE management  Ambulation/Gait                Stairs            Wheelchair Mobility    Modified Rankin (Stroke Patients Only)       Balance                                             Pertinent Vitals/Pain Pain Assessment: 0-10 Pain Score: 6  Pain Location: right knee Pain Descriptors / Indicators: Aching;Sore Pain Intervention(s): Limited activity within patient's tolerance;Premedicated before  session;Repositioned;Ice applied;Monitored during session    Home Living Family/patient expects to be discharged to:: Private residence Living Arrangements: Spouse/significant other   Type of Home: House Home Access: Stairs to enter Entrance Stairs-Rails: Can reach both Entrance Stairs-Number of Steps: 3 small steps "with handicapped bars" Home Layout: One level Home Equipment: None      Prior Function Level of Independence: Independent         Comments: using her mother's RW recently d/t knee apin      Hand Dominance        Extremity/Trunk Assessment   Upper Extremity Assessment Upper Extremity Assessment: Overall WFL for tasks assessed    Lower Extremity Assessment Lower Extremity Assessment: RLE deficits/detail RLE Deficits / Details: ankle WFL; knee extension and hip flexion 2+/5; AAROM -10* to 45*, limited by post op pain       Communication   Communication: No difficulties  Cognition Arousal/Alertness: Awake/alert Behavior During Therapy: WFL for tasks assessed/performed Overall Cognitive Status: Within Functional Limits for tasks assessed                                        General Comments      Exercises  Assessment/Plan    PT Assessment Patient needs continued PT services  PT Problem List Decreased strength;Decreased range of motion;Decreased activity tolerance;Decreased mobility;Pain;Decreased knowledge of use of DME       PT Treatment Interventions DME instruction;Gait training;Functional mobility training;Stair training;Patient/family education;Therapeutic exercise;Therapeutic activities    PT Goals (Current goals can be found in the Care Plan section)  Acute Rehab PT Goals Patient Stated Goal: less knee pain PT Goal Formulation: With patient Time For Goal Achievement: 06/22/18 Potential to Achieve Goals: Good    Frequency 7X/week   Barriers to discharge        Co-evaluation               AM-PAC PT  "6 Clicks" Daily Activity  Outcome Measure Difficulty turning over in bed (including adjusting bedclothes, sheets and blankets)?: Unable Difficulty moving from lying on back to sitting on the side of the bed? : Unable Difficulty sitting down on and standing up from a chair with arms (e.g., wheelchair, bedside commode, etc,.)?: Unable Help needed moving to and from a bed to chair (including a wheelchair)?: A Little Help needed walking in hospital room?: A Little Help needed climbing 3-5 steps with a railing? : A Lot 6 Click Score: 11    End of Session Equipment Utilized During Treatment: Gait belt;Right knee immobilizer Activity Tolerance: Patient tolerated treatment well Patient left: Other (comment);with call bell/phone within reach;with family/visitor present(on BSC d/t void)   PT Visit Diagnosis: Difficulty in walking, not elsewhere classified (R26.2)    Time: 1610-9604 PT Time Calculation (min) (ACUTE ONLY): 16 min   Charges:   PT Evaluation $PT Eval Low Complexity: 1 Low     PT G CodesDrucilla Chalet, PT Pager: [phone] 06/15/2018   [facility] 06/15/2018, 6:23 PM

## 2018-06-15 NOTE — Anesthesia Postprocedure Evaluation (Signed)
Anesthesia Post Note  Patient: Natalie Sanders  Procedure(s) Performed: RIGHT TOTAL KNEE ARTHROPLASTY (Right Knee)     Patient location during evaluation: PACU Anesthesia Type: Spinal Level of consciousness: awake and alert Pain management: pain level controlled Vital Signs Assessment: post-procedure vital signs reviewed and stable Respiratory status: spontaneous breathing and respiratory function stable Cardiovascular status: blood pressure returned to baseline and stable Postop Assessment: no headache, no backache, spinal receding and no apparent nausea or vomiting Anesthetic complications: no    Last Vitals:  Vitals:   06/15/18 1143 06/15/18 1232  BP: 116/72 (!) 97/42  Pulse: 60 (!) 57  Resp: 12 14  Temp: 36.5 C 36.8 C  SpO2: 99% 94%    Last Pain:  Vitals:   06/15/18 1300  TempSrc:   PainSc: 5                  Phillips Groutarignan, Vienne Corcoran

## 2018-06-15 NOTE — Transfer of Care (Signed)
Immediate Anesthesia Transfer of Care Note  Patient: Natalie Sanders  Procedure(s) Performed: RIGHT TOTAL KNEE ARTHROPLASTY (Right Knee)  Patient Location: PACU  Anesthesia Type:Spinal  Level of Consciousness: awake, alert , oriented and patient cooperative  Airway & Oxygen Therapy: Patient Spontanous Breathing and Patient connected to face mask oxygen  Post-op Assessment: Report given to RN and Post -op Vital signs reviewed and stable  Post vital signs: Reviewed and stable  Last Vitals:  Vitals Value Taken Time  BP 116/59 06/15/2018 10:03 AM  Temp    Pulse 69 06/15/2018 10:05 AM  Resp 15 06/15/2018 10:05 AM  SpO2 100 % 06/15/2018 10:05 AM  Vitals shown include unvalidated device data.  Last Pain:  Vitals:   06/15/18 0605  TempSrc: Oral         Complications: No apparent anesthesia complications

## 2018-06-15 NOTE — H&P (Signed)
PREOPERATIVE H&P  Chief Complaint: right knee pain  HPI: Natalie Sanders is a 67 y.o. female who presents for preoperative history and physical with a diagnosis of right knee oa. Symptoms are rated as moderate to severe, and have been worsening.  This is significantly impairing activities of daily living.  She has elected for surgical management.   She has failed injections, activity modification, anti-inflammatories, and assistive devices.  Preoperative X-rays demonstrate end stage degenerative changes with osteophyte formation, loss of joint space, subchondral sclerosis.   Past Medical History:  Diagnosis Date  . Arthritis   . Complication of anesthesia    hypotension after cholecystectomy  . Difficulty swallowing pills    especially large pills  . 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 Full dentures   . GERD (gastroesophageal reflux disease)   . High cholesterol   . History of hiatal hernia   . Hypothyroidism    Past Surgical History:  Procedure Laterality Date  . ABDOMINAL HYSTERECTOMY     partial  . ANKLE RECONSTRUCTION Left 02/23/2014   Procedure: LEFT FIFTH BASE EXOSTOSIS/PERONEUS BREVIS REPAIR;  Surgeon: Toni Arthurs, MD;  Location: [facility];  Service: Orthopedics;  Laterality: Left;  . ANTERIOR CERVICAL DECOMP/DISCECTOMY FUSION N/A 04/16/2017   Procedure: ANTERIOR CERVICAL DECOMPRESSION/FUSION CERVICAL FIVE-SIX, CERVICAL SIX-SEVEN;  Surgeon: Julio Sicks, MD;  Location: [facility] OR;  Service: Neurosurgery;  Laterality: N/A;  right side approach  . APPENDECTOMY    . CHOLECYSTECTOMY  2005  . ESOPHAGOGASTRODUODENOSCOPY ENDOSCOPY  03/2017  . IR ESOPHAGUS DILITATION RETRO FLUORO     Social History   Socioeconomic History  . Marital status: Married    Spouse name: Not on file  . Number of children: Not on file  . Years of education: Not on file  . Highest education level: Not on file  Occupational  History  . Not on file  Social Needs  . Financial resource strain: Not on file  . Food insecurity:    Worry: Not on file    Inability: Not on file  . Transportation needs:    Medical: Not on file    Non-medical: Not on file  Tobacco Use  . Smoking status: Former Smoker    Last attempt to quit: 12/07/2006    Years since quitting: 11.5  . Smokeless tobacco: Never Used  Substance and Sexual Activity  . Alcohol use: No  . Drug use: No  . Sexual activity: Not on file  Lifestyle  . Physical activity:    Days per week: Not on file    Minutes per session: Not on file  . Stress: Not on file  Relationships  . Social connections:    Talks on phone: Not on file    Gets together: Not on file    Attends religious service: Not on file    Active member of club or organization: Not on file    Attends meetings of clubs or organizations: Not on file    Relationship status: Not on file  Other Topics Concern  . Not on file  Social History Narrative  . Not on file   History reviewed. No pertinent family history. Allergies  Allergen Reactions  . Methylprednisolone     Redness   . Darvon [Propoxyphene] Nausea And Vomiting  . Liraglutide Nausea Only  . Lorcaserin Other (See Comments)    HEADACHE  . Naltrexone-Bupropion Hcl Er Other (See Comments)    "UNABLE  TO SWALLOW TABLETS"   Prior to Admission medications   Medication Sig Start Date End Date Taking? Authorizing Provider  acetaminophen (TYLENOL) 500 MG tablet Take 1,000 mg by mouth 2 (two) times daily as needed for moderate pain.   Yes [provider]  HYDROcodone-acetaminophen (NORCO/VICODIN) 5-325 MG tablet Take 1-2 tablets by mouth every 4 (four) hours as needed (breakthrough pain). Patient taking differently: Take 0.5 tablets by mouth 2 (two) times daily as needed (breakthrough pain).  04/17/17  Yes Pool, Sherilyn CooterHenry, MD  levothyroxine (SYNTHROID) 75 MCG tablet Take 75 mcg by mouth daily before breakfast.    Yes [provider]  meloxicam (MOBIC) 7.5 MG tablet Take 7.5 mg by mouth every evening.    Yes [provider]  pantoprazole (PROTONIX) 40 MG tablet Take 40 mg by mouth every evening.  02/13/17  Yes [provider]  simvastatin (ZOCOR) 40 MG tablet Take 40 mg by mouth at bedtime.  03/10/17  Yes [provider]  Soft Lens Products (OPTI-FREE REWETTING DROPS) SOLN Place 1 drop into both eyes daily as needed (dry eyes).   Yes [provider]     Positive ROS: All other systems have been reviewed and were otherwise negative with the exception of those mentioned in the HPI and as above.  Physical Exam: General: Alert, no acute distress Cardiovascular: No pedal edema Respiratory: No cyanosis, no use of accessory musculature GI: No organomegaly, abdomen is soft and non-tender Skin: No lesions in the area of chief complaint Neurologic: Sensation intact distally Psychiatric: Patient is competent for consent with normal mood and affect Lymphatic: No axillary or cervical lymphadenopathy  MUSCULOSKELETAL: right knee with crepitance and AROM 0-110 with effusion  Assessment: Right knee osteoarthritis  Body mass index is 39.14 kg/m.    Plan: Plan for Procedure(s): RIGHT TOTAL KNEE ARTHROPLASTY  The risks benefits and alternatives were discussed with the patient including but not limited to the risks of nonoperative treatment, versus surgical intervention including infection, bleeding, nerve injury,  blood clots, cardiopulmonary complications, morbidity, mortality, among others, and they were willing to proceed.   Anticipated LOS equal to or greater than 2 midnights due to - Age 67 and older with one or more of the following:  - Obesity  - Expected need for hospital services (PT, OT, Nursing) required for safe  discharge  - Anticipated need for postoperative skilled nursing care or inpatient rehab  - Active co-morbidities: None OR   - Unanticipated findings  during/Post Surgery: None  - Patient is a high risk of re-admission due to: None    Preoperative templating of the joint replacement has been completed, documented, and submitted to the Operating Room personnel in order to optimize intra-operative equipment management.  Eulas PostJoshua P Floye Fesler, MD Cell [phone]   06/15/2018 7:17 AM

## 2018-06-15 NOTE — Progress Notes (Signed)
   06/15/18 1823  PT Visit Information  Last PT Received On 06/15/18  Assistance Needed +1  History of Present Illness R TKA  Subjective Data  Patient Stated Goal less knee pain  Precautions  Precautions Knee;Fall  Required Braces or Orthoses Knee Immobilizer - Right  Restrictions  Weight Bearing Restrictions No  Other Position/Activity Restrictions WBAT  Pain Assessment  Pain Assessment 0-10  Pain Score 5  Pain Location right knee  Pain Descriptors / Indicators Aching;Sore  Pain Intervention(s) Limited activity within patient's tolerance;Monitored during session;Premedicated before session;Repositioned;Ice applied  Cognition  Arousal/Alertness Awake/alert  Behavior During Therapy WFL for tasks assessed/performed  Overall Cognitive Status Within Functional Limits for tasks assessed  Bed Mobility  General bed mobility comments pt on BSC  Transfers  Overall transfer level Needs assistance  Equipment used Rolling walker (2 wheeled)  Transfers Sit to/from Stand  Sit to Stand Min assist  General transfer comment cues for hand placement and RLE management  Ambulation/Gait  Ambulation/Gait assistance Min guard;Min assist  Gait Distance (Feet) 55 Feet  Assistive device Rolling walker (2 wheeled)  Gait Pattern/deviations Step-to pattern;Decreased weight shift to right  General Gait Details cues for sequence and RW position  Total Joint Exercises  Ankle Circles/Pumps AROM;Both;10 reps  Quad Sets AROM;Both;5 reps  PT - End of Session  Equipment Utilized During Treatment Gait belt;Right knee immobilizer  Activity Tolerance Patient tolerated treatment well  Patient left in chair;with call bell/phone within reach;with family/visitor present;with chair alarm set   PT - Assessment/Plan  PT Plan Current plan remains appropriate  PT Visit Diagnosis Difficulty in walking, not elsewhere classified (R26.2)  PT Frequency (ACUTE ONLY) 7X/week  Follow Up Recommendations Follow surgeon's  recommendation for DC plan and follow-up therapies  PT equipment Rolling walker with 5" wheels;3in1 (PT)  AM-PAC PT "6 Clicks" Daily Activity Outcome Measure  Difficulty turning over in bed (including adjusting bedclothes, sheets and blankets)? 1  Difficulty moving from lying on back to sitting on the side of the bed?  1  Difficulty sitting down on and standing up from a chair with arms (e.g., wheelchair, bedside commode, etc,.)? 1  Help needed moving to and from a bed to chair (including a wheelchair)? 3  Help needed walking in hospital room? 3  Help needed climbing 3-5 steps with a railing?  3  6 Click Score 12  Mobility G Code  CL  PT Goal Progression  Progress towards PT goals Progressing toward goals  Acute Rehab PT Goals  PT Goal Formulation With patient  Time For Goal Achievement 06/22/18  Potential to Achieve Goals Good  PT Time Calculation  PT Start Time (ACUTE ONLY) 1723  PT Stop Time (ACUTE ONLY) 1735  PT Time Calculation (min) (ACUTE ONLY) 12 min  PT General Charges  $$ ACUTE PT VISIT 1 Visit  PT Treatments  $Gait Training 8-22 mins

## 2018-06-16 ENCOUNTER — Encounter (HOSPITAL_COMMUNITY): Payer: Self-pay | Admitting: Orthopedic Surgery

## 2018-06-16 LAB — BASIC METABOLIC PANEL
Anion gap: 7 (ref 5–15)
BUN: 14 mg/dL (ref 8–23)
CHLORIDE: 105 mmol/L (ref 98–111)
CO2: 25 mmol/L (ref 22–32)
Calcium: 8.5 mg/dL — ABNORMAL LOW (ref 8.9–10.3)
Creatinine, Ser: 1.03 mg/dL — ABNORMAL HIGH (ref 0.44–1.00)
GFR calc Af Amer: >60 mL/min (ref >60)
GFR calc non Af Amer: 55 mL/min — ABNORMAL LOW (ref >60)
GLUCOSE: 121 mg/dL — AB (ref 70–99)
Potassium: 4.4 mmol/L (ref 3.5–5.1)
Sodium: 137 mmol/L (ref 135–145)

## 2018-06-16 LAB — CBC
HEMATOCRIT: 37.6 % (ref 36.0–46.0)
HEMOGLOBIN: 12.2 g/dL (ref 12.0–15.0)
MCH: 30.4 pg (ref 26.0–34.0)
MCHC: 32.4 g/dL (ref 30.0–36.0)
MCV: 93.8 fL (ref 78.0–100.0)
Platelets: 184 10*3/uL (ref 150–400)
RBC: 4.01 MIL/uL (ref 3.87–5.11)
RDW: 13 % (ref 11.5–15.5)
WBC: 8.3 10*3/uL (ref 4.0–10.5)

## 2018-06-16 NOTE — Progress Notes (Addendum)
Patient ID: Natalie Sanders, female   DOB: 26-Dec-1950, 67 y.o.   MRN: [id number]     Subjective:  Patient reports pain as mild to moderate.  Patient in bed and in no acute distress.  Denies any CP or SOB  Objective:   VITALS:   Vitals:   06/15/18 1904 06/15/18 2206 06/16/18 0145 06/16/18 0620  BP: (!) 97/42 (!) 122/55 (!) 115/52 (!) 99/54  Pulse: (!) 57 61 63 (!) 57  Resp: 14 15  15   Temp: 98.3 F (36.8 C) 98.3 F (36.8 C) 98.1 F (36.7 C) 98.2 F (36.8 C)  TempSrc: Oral Oral Oral Oral  SpO2: 98% 94% 95% 96%  Weight:      Height:        ABD soft Sensation intact distally Dorsiflexion/Plantar flexion intact Incision: dressing C/D/I and no drainage   Lab Results  Component Value Date   WBC 8.3 06/16/2018   HGB 12.2 06/16/2018   HCT 37.6 06/16/2018   MCV 93.8 06/16/2018   PLT 184 06/16/2018   BMET    Component Value Date/Time   NA 137 06/16/2018 0407   K 4.4 06/16/2018 0407   CL 105 06/16/2018 0407   CO2 25 06/16/2018 0407   GLUCOSE 121 (H) 06/16/2018 0407   BUN 14 06/16/2018 0407   CREATININE 1.03 (H) 06/16/2018 0407   CALCIUM 8.5 (L) 06/16/2018 0407   GFRNONAA 55 (L) 06/16/2018 0407   GFRAA >60 06/16/2018 0407     Assessment/Plan: 1 Day Post-Op   Principal Problem:   Primary localized osteoarthritis of right knee Active Problems:   S/P knee replacement   S/P total knee arthroplasty   Advance diet Up with therapy Plan for discharge tomorrow Good foot and ankle motion  Anticipated LOS equal to or greater than 2 midnights due to - Age 67 and older with one or more of the following:  - Obesity  - Expected need for hospital services (PT, OT, Nursing) required for safe  discharge  - Anticipated need for postoperative skilled nursing care or inpatient rehab  - Active co-morbidities: None OR   - Unanticipated findings during/Post Surgery: Slow post-op progression: GI, pain control, mobility  - Patient is a high risk of re-admission due to:  None     Natalie Sanders, Natalie Sanders 06/16/2018, 8:12 AM  Discussed and agree with above.   Natalie LucyJoshua Kelton Bultman, MD Cell [phone]

## 2018-06-16 NOTE — Progress Notes (Signed)
Physical Therapy Treatment Patient Details Name: Natalie Sanders MRN: [id number] DOB: 02-15-1951 Today's Date: 06/16/2018    History of Present Illness R TKA    PT Comments    Pt assisted with ambulating in hallway and performed exercises in recliner.  Pt reports plan for d/c home tomorrow.   Follow Up Recommendations  Follow surgeon's recommendation for DC plan and follow-up therapies     Equipment Recommendations  Rolling walker with 5" wheels;3in1 (PT)    Recommendations for Other Services       Precautions / Restrictions Precautions Precautions: Knee;Fall Required Braces or Orthoses: Knee Immobilizer - Right Restrictions Weight Bearing Restrictions: No Other Position/Activity Restrictions: WBAT    Mobility  Bed Mobility Overal bed mobility: Needs Assistance Bed Mobility: Supine to Sit     Supine to sit: Min guard     General bed mobility comments: verbal cues for self assist  Transfers Overall transfer level: Needs assistance Equipment used: Rolling walker (2 wheeled) Transfers: Sit to/from Stand Sit to Stand: Min assist         General transfer comment: cues for hand placement and R LE management  Ambulation/Gait Ambulation/Gait assistance: Min guard Gait Distance (Feet): 60 Feet Assistive device: Rolling walker (2 wheeled) Gait Pattern/deviations: Step-to pattern;Decreased stance time - right     General Gait Details: cues for sequence and RW position, slow but steady pace   Optometrist    Modified Rankin (Stroke Patients Only)       Balance                                            Cognition Arousal/Alertness: Awake/alert Behavior During Therapy: WFL for tasks assessed/performed Overall Cognitive Status: Within Functional Limits for tasks assessed                                        Exercises Total Joint Exercises Ankle Circles/Pumps: AROM;Both;10  reps Quad Sets: AROM;Both;10 reps Short Arc Quad: AAROM;10 reps;Right Heel Slides: AAROM;10 reps;Right Hip ABduction/ADduction: AROM;10 reps;Right Straight Leg Raises: AAROM;10 reps;Right Goniometric ROM: approximately -8-40* knee AAROM    General Comments        Pertinent Vitals/Pain Pain Assessment: 0-10 Pain Score: 4  Pain Location: right knee Pain Descriptors / Indicators: Aching;Sore Pain Intervention(s): Limited activity within patient's tolerance;Repositioned;Monitored during session;Premedicated before session;Ice applied    Home Living                      Prior Function            PT Goals (current goals can now be found in the care plan section) Progress towards PT goals: Progressing toward goals    Frequency    7X/week      PT Plan Current plan remains appropriate    Co-evaluation              AM-PAC PT "6 Clicks" Daily Activity  Outcome Measure  Difficulty turning over in bed (including adjusting bedclothes, sheets and blankets)?: A Lot Difficulty moving from lying on back to sitting on the side of the bed? : A Lot Difficulty sitting down on and standing up from a chair with arms (e.g., wheelchair, bedside  commode, etc,.)?: A Little Help needed moving to and from a bed to chair (including a wheelchair)?: A Little Help needed walking in hospital room?: A Little Help needed climbing 3-5 steps with a railing? : A Little 6 Click Score: 16    End of Session Equipment Utilized During Treatment: Gait belt;Right knee immobilizer Activity Tolerance: Patient tolerated treatment well Patient left: in chair;with call bell/phone within reach;with chair alarm set Nurse Communication: Mobility status PT Visit Diagnosis: Difficulty in walking, not elsewhere classified (R26.2)     Time: [id number] PT Time Calculation (min) (ACUTE ONLY): 27 min  Charges:  $Gait Training: 8-22 mins $Therapeutic Exercise: 8-22 mins                    G Codes:       Zenovia JarredKati Patricia Perales, PT, DPT 06/16/2018 Pager: [phone]  Maida SaleLEMYRE,KATHrine E 06/16/2018, 11:50 AM

## 2018-06-16 NOTE — Plan of Care (Signed)
Plan of care discussed with patient 

## 2018-06-16 NOTE — Progress Notes (Signed)
Physical Therapy Treatment Patient Details Name: Natalie SartoriusGloria M Sanders MRN: [id number] DOB: [birth year] Today's Date: 06/16/2018    History of Present Illness R TKA    PT Comments    Pt assisted with ambulating in hallway again this afternoon and wished to improve distance.  Pt reports 6/10 pain however willing to continue, RN notified, and RN to bring pain meds once pt back in bed.  Follow Up Recommendations  Follow surgeon's recommendation for DC plan and follow-up therapies     Equipment Recommendations  Rolling walker with 5" wheels;3in1 (PT)    Recommendations for Other Services       Precautions / Restrictions Precautions Precautions: Knee;Fall Required Braces or Orthoses: Knee Immobilizer - Right Restrictions Other Position/Activity Restrictions: WBAT    Mobility  Bed Mobility Overal bed mobility: Needs Assistance Bed Mobility: Sit to Supine       Sit to supine: Min guard;HOB elevated   General bed mobility comments: verbal cues for self assist  Transfers Overall transfer level: Needs assistance Equipment used: Rolling walker (2 wheeled) Transfers: Sit to/from Stand Sit to Stand: Min guard         General transfer comment: cues for hand placement and R LE management  Ambulation/Gait   Gait Distance (Feet): 160 Feet Assistive device: Rolling walker (2 wheeled) Gait Pattern/deviations: Step-to pattern;Decreased stance time - right     General Gait Details: cues for sequence and RW position, slow but steady pace, reports 6/10 knee pain however wished to improve distance this afternoon   Stairs             Wheelchair Mobility    Modified Rankin (Stroke Patients Only)       Balance                                            Cognition Arousal/Alertness: Awake/alert Behavior During Therapy: WFL for tasks assessed/performed Overall Cognitive Status: Within Functional Limits for tasks assessed                                        Exercises      General Comments        Pertinent Vitals/Pain Pain Assessment: 0-10 Pain Score: 6  Pain Location: right knee Pain Descriptors / Indicators: Aching;Sore Pain Intervention(s): Limited activity within patient's tolerance;Repositioned;Monitored during session;Patient requesting pain meds-RN notified    Home Living                      Prior Function            PT Goals (current goals can now be found in the care plan section) Progress towards PT goals: Progressing toward goals    Frequency    7X/week      PT Plan Current plan remains appropriate    Co-evaluation              AM-PAC PT "6 Clicks" Daily Activity  Outcome Measure  Difficulty turning over in bed (including adjusting bedclothes, sheets and blankets)?: A Little Difficulty moving from lying on back to sitting on the side of the bed? : A Lot Difficulty sitting down on and standing up from a chair with arms (e.g., wheelchair, bedside commode, etc,.)?: A Little Help needed moving to and from a  bed to chair (including a wheelchair)?: A Little Help needed walking in hospital room?: A Little Help needed climbing 3-5 steps with a railing? : A Little 6 Click Score: 17    End of Session Equipment Utilized During Treatment: Right knee immobilizer Activity Tolerance: Patient tolerated treatment well Patient left: with call bell/phone within reach;in bed;with family/visitor present Nurse Communication: Mobility status;Patient requests pain meds PT Visit Diagnosis: Difficulty in walking, not elsewhere classified (R26.2)     Time: 1610-9604 PT Time Calculation (min) (ACUTE ONLY): 24 min  Charges:  $Gait Training: 23-37 mins                    G Codes:      Zenovia Jarred, PT, DPT 06/16/2018 Pager: [phone]  Maida Sale E 06/16/2018, 4:34 PM

## 2018-06-17 LAB — CBC
HCT: 36.4 % (ref 36.0–46.0)
Hemoglobin: 11.9 g/dL — ABNORMAL LOW (ref 12.0–15.0)
MCH: 30.1 pg (ref 26.0–34.0)
MCHC: 32.7 g/dL (ref 30.0–36.0)
MCV: 92.2 fL (ref 78.0–100.0)
Platelets: 174 10*3/uL (ref 150–400)
RBC: 3.95 MIL/uL (ref 3.87–5.11)
RDW: 12.8 % (ref 11.5–15.5)
WBC: 12.5 10*3/uL — ABNORMAL HIGH (ref 4.0–10.5)

## 2018-06-17 NOTE — Plan of Care (Signed)
Pt to dc home. Instructions given to patient and husband. IV removed.

## 2018-06-17 NOTE — Progress Notes (Addendum)
Discharge planning, spoke with patient and spouse at beside. [facility] to [facility]. Contacted [facility] for referral. Needs a RW and 3-n-1, contacted [facility] to deliver to room. Contacted attending to address HHPT orders, he states he will enter them. Patient anxious to leave per nurse. [phone]

## 2018-06-17 NOTE — Progress Notes (Signed)
Physical Therapy Treatment Patient Details Name: Natalie Sanders MRN: [id number] DOB: 07/05/1951 Today's Date: 06/17/2018    History of Present Illness R TKA    PT Comments    Pt ambulated in hallway and practiced safe stair technique with spouse.  Pt also performed LE exercises and provided with HEP.  Pt feels ready for d/c home.  Follow Up Recommendations  Follow surgeon's recommendation for DC plan and follow-up therapies     Equipment Recommendations  Rolling walker with 5" wheels;3in1 (PT)    Recommendations for Other Services       Precautions / Restrictions Precautions Precautions: Knee;Fall Required Braces or Orthoses: Knee Immobilizer - Right Restrictions Other Position/Activity Restrictions: WBAT    Mobility  Bed Mobility Overal bed mobility: Needs Assistance Bed Mobility: Sit to Supine;Supine to Sit     Supine to sit: Min guard;HOB elevated Sit to supine: Min guard;HOB elevated   General bed mobility comments: verbal cues for self assist  Transfers Overall transfer level: Needs assistance Equipment used: Rolling walker (2 wheeled) Transfers: Sit to/from Stand Sit to Stand: Min guard;Min assist         General transfer comment: cues for hand placement and R LE management, initial difficulty with rise with hand placement on soft bed  Ambulation/Gait Ambulation/Gait assistance: Min guard Gait Distance (Feet): 160 Feet Assistive device: Rolling walker (2 wheeled) Gait Pattern/deviations: Step-to pattern;Decreased stance time - right     General Gait Details: cues for sequence and RW position, slow but steady pace   Stairs Stairs: Yes Stairs assistance: Min guard Stair Management: Step to pattern;One rail Left;Forwards Number of Stairs: 3 General stair comments: verbal cues for sequence and safety, pt utilized left rail and spouse supported her on right side, pt and spouse reported understanding   Wheelchair Mobility    Modified Rankin  (Stroke Patients Only)       Balance                                            Cognition Arousal/Alertness: Awake/alert Behavior During Therapy: WFL for tasks assessed/performed Overall Cognitive Status: Within Functional Limits for tasks assessed                                        Exercises Total Joint Exercises Ankle Circles/Pumps: AROM;Both;10 reps Quad Sets: AROM;Both;10 reps Short Arc Quad: AAROM;10 reps;Right Heel Slides: AAROM;10 reps;Right Hip ABduction/ADduction: AROM;10 reps;Right Straight Leg Raises: AAROM;10 reps;Right Goniometric ROM: approximately -5-45* knee AAROM    General Comments        Pertinent Vitals/Pain Pain Assessment: 0-10 Pain Score: 4  Pain Location: right knee Pain Descriptors / Indicators: Aching;Sore Pain Intervention(s): Limited activity within patient's tolerance;Repositioned;Monitored during session    Home Living                      Prior Function            PT Goals (current goals can now be found in the care plan section) Progress towards PT goals: Progressing toward goals    Frequency    7X/week      PT Plan Current plan remains appropriate    Co-evaluation              AM-PAC PT "6 Clicks"  Daily Activity  Outcome Measure  Difficulty turning over in bed (including adjusting bedclothes, sheets and blankets)?: A Little Difficulty moving from lying on back to sitting on the side of the bed? : A Little Difficulty sitting down on and standing up from a chair with arms (e.g., wheelchair, bedside commode, etc,.)?: A Little Help needed moving to and from a bed to chair (including a wheelchair)?: A Little Help needed walking in hospital room?: A Little Help needed climbing 3-5 steps with a railing? : A Little 6 Click Score: 18    End of Session Equipment Utilized During Treatment: Right knee immobilizer Activity Tolerance: Patient tolerated treatment well Patient  left: with call bell/phone within reach;in bed;with family/visitor present   PT Visit Diagnosis: Difficulty in walking, not elsewhere classified (R26.2)     Time: 6045-4098 PT Time Calculation (min) (ACUTE ONLY): 41 min  Charges:  $Gait Training: 23-37 mins $Therapeutic Exercise: 8-22 mins                    G Codes:      Zenovia Jarred, PT, DPT 06/17/2018 Pager: [phone]  Maida Sale E 06/17/2018, 1:07 PM

## 2018-06-17 NOTE — Discharge Summary (Signed)
Physician Discharge Summary  Patient ID: Natalie Sanders MRN: [id number] DOB/AGE: 12/23/1950 67 y.o.  Admit date: 06/15/2018 Discharge date: 06/17/2018  Admission Diagnoses:  Primary localized osteoarthritis of right knee  Discharge Diagnoses:  Principal Problem:   Primary localized osteoarthritis of right knee Active Problems:   S/P knee replacement   S/P total knee arthroplasty   Past Medical History:  Diagnosis Date  . Arthritis   . Complication of anesthesia    hypotension after cholecystectomy  . Difficulty swallowing pills    especially large pills  . 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 Full dentures   . GERD (gastroesophageal reflux disease)   . High cholesterol   . History of hiatal hernia   . Hypothyroidism   . Primary localized osteoarthritis of right knee 06/15/2018    Surgeries: Procedure(s): RIGHT TOTAL KNEE ARTHROPLASTY on 06/15/2018   Consultants (if any):   Discharged Condition: Improved  Hospital Course: Natalie Sanders is an 67 y.o. female who was admitted 06/15/2018 with a diagnosis of Primary localized osteoarthritis of right knee and went to the operating room on 06/15/2018 and underwent the above named procedures.    She was given perioperative antibiotics:  Anti-infectives (From admission, onward)   Start     Dose/Rate Route Frequency Ordered Stop   06/15/18 1400  ceFAZolin (ANCEF) IVPB 2g/100 mL premix     2 g 200 mL/hr over 30 Minutes Intravenous Every 6 hours 06/15/18 1150 06/15/18 2047   06/15/18 0600  ceFAZolin (ANCEF) IVPB 2g/100 mL premix     2 g 200 mL/hr over 30 Minutes Intravenous On call to O.R. 06/15/18 4098 06/15/18 0754    .  She was given sequential compression devices, early ambulation, and aspirin for DVT prophylaxis.  She benefited maximally from the hospital stay and there were no complications.    Recent vital signs:  Vitals:   06/16/18 2137 06/17/18 0520  BP:  (!) 126/53 138/61  Pulse: 67 64  Resp: 18 18  Temp: 98.4 F (36.9 C) 98.3 F (36.8 C)  SpO2: 95% 94%    Recent laboratory studies:  Lab Results  Component Value Date   HGB 11.9 (L) 06/17/2018   HGB 12.2 06/16/2018   HGB 13.5 06/03/2018   Lab Results  Component Value Date   WBC 12.5 (H) 06/17/2018   PLT 174 06/17/2018   No results found for: INR Lab Results  Component Value Date   NA 137 06/16/2018   K 4.4 06/16/2018   CL 105 06/16/2018   CO2 25 06/16/2018   BUN 14 06/16/2018   CREATININE 1.03 (H) 06/16/2018   GLUCOSE 121 (H) 06/16/2018    Discharge Medications:   Allergies as of 06/17/2018      Reactions   Methylprednisolone    Redness    Darvon [propoxyphene] Nausea And Vomiting   Liraglutide Nausea Only   Lorcaserin Other (See Comments)   HEADACHE   Naltrexone-bupropion Hcl Er Other (See Comments)   "UNABLE TO SWALLOW TABLETS"      Medication List    STOP taking these medications   HYDROcodone-acetaminophen 5-325 MG tablet Commonly known as:  NORCO/VICODIN     TAKE these medications   acetaminophen 500 MG tablet Commonly known as:  TYLENOL Take 1,000 mg by mouth 2 (two) times daily as needed for moderate pain.   aspirin EC 325 MG tablet Take 1 tablet (325 mg total) by mouth daily.  baclofen 10 MG tablet Commonly known as:  LIORESAL Take 1 tablet (10 mg total) by mouth 3 (three) times daily. As needed for muscle spasm   meloxicam 7.5 MG tablet Commonly known as:  MOBIC Take 7.5 mg by mouth every evening.   ondansetron 4 MG tablet Commonly known as:  ZOFRAN Take 1 tablet (4 mg total) by mouth every 8 (eight) hours as needed for nausea or vomiting.   OPTI-FREE REWETTING DROPS Soln Place 1 drop into both eyes daily as needed (dry eyes).   oxyCODONE 5 MG immediate release tablet Commonly known as:  ROXICODONE Take 1 tablet (5 mg total) by mouth every 4 (four) hours as needed for severe pain.   pantoprazole 40 MG tablet Commonly known as:   PROTONIX Take 40 mg by mouth every evening.   sennosides-docusate sodium 8.6-50 MG tablet Commonly known as:  SENOKOT-S Take 2 tablets by mouth daily.   simvastatin 40 MG tablet Commonly known as:  ZOCOR Take 40 mg by mouth at bedtime.   SYNTHROID 75 MCG tablet Generic drug:  levothyroxine Take 75 mcg by mouth daily before breakfast.       Diagnostic Studies: Dg Knee Right Port  Result Date: 06/15/2018 CLINICAL DATA:  Postop day 0 RIGHT total knee arthroplasty for osteoarthritis. EXAM: PORTABLE RIGHT KNEE - 1-2 VIEW COMPARISON:  08/15/2016. FINDINGS: Anatomic alignment post RIGHT total knee arthroplasty. No acute complicating features. IMPRESSION: Anatomic alignment post RIGHT total knee arthroplasty without acute complicating features. Electronically Signed   By: Hulan Saashomas  Lawrence M.D.   On: 06/15/2018 15:18    Disposition: Discharge disposition: 01-Home or Self Care         Follow-up Information    Teryl LucyLandau, Neida Ellegood, MD. Schedule an appointment as soon as possible for a visit in 2 weeks.   Specialty:  Orthopedic Surgery Contact information: [address] [place] [address] [phone]            Signed: Eulas PostJoshua P Anaiyah Anglemyer 06/17/2018, 9:55 AM

## 2018-06-17 NOTE — Progress Notes (Addendum)
Patient ID: Natalie SartoriusGloria M Sanders, female   DOB: Apr 10, 1951, 67 y.o.   MRN: [id number]     Subjective:  Patient reports pain as mild to moderate.  States that she got behind on the pain last night but doing better right now  Objective:   VITALS:   Vitals:   06/16/18 1126 06/16/18 1352 06/16/18 2137 06/17/18 0520  BP: (!) 126/55 126/64 (!) 126/53 138/61  Pulse: (!) 59 62 67 64  Resp: 13 12 18 18   Temp: 98.6 F (37 C) 98.1 F (36.7 C) 98.4 F (36.9 C) 98.3 F (36.8 C)  TempSrc: Oral Oral Oral Oral  SpO2: (!) 88% 94% 95% 94%  Weight:      Height:        ABD soft Sensation intact distally Dorsiflexion/Plantar flexion intact Incision: dressing C/D/I and no drainage   Lab Results  Component Value Date   WBC 12.5 (H) 06/17/2018   HGB 11.9 (L) 06/17/2018   HCT 36.4 06/17/2018   MCV 92.2 06/17/2018   PLT 174 06/17/2018   BMET    Component Value Date/Time   NA 137 06/16/2018 0407   K 4.4 06/16/2018 0407   CL 105 06/16/2018 0407   CO2 25 06/16/2018 0407   GLUCOSE 121 (H) 06/16/2018 0407   BUN 14 06/16/2018 0407   CREATININE 1.03 (H) 06/16/2018 0407   CALCIUM 8.5 (L) 06/16/2018 0407   GFRNONAA 55 (L) 06/16/2018 0407   GFRAA >60 06/16/2018 0407     Assessment/Plan: 2 Days Post-Op   Principal Problem:   Primary localized osteoarthritis of right knee Active Problems:   S/P knee replacement   S/P total knee arthroplasty   Advance diet Up with therapy Discharge home with home health WBAT Dry dressing PRN Follow up with Dr Dion SaucierLandau as scheduled      Torrie MayersUGLAS PARRY, BRANDON 06/17/2018, 7:06 AM  Discussed and agree with above.   Teryl LucyJoshua Alieu Finnigan, MD Cell [phone]

## 2020-03-14 ENCOUNTER — Other Ambulatory Visit: Payer: Self-pay

## 2020-03-14 ENCOUNTER — Emergency Department (HOSPITAL_BASED_OUTPATIENT_CLINIC_OR_DEPARTMENT_OTHER): Payer: Medicare HMO

## 2020-03-14 ENCOUNTER — Emergency Department (HOSPITAL_BASED_OUTPATIENT_CLINIC_OR_DEPARTMENT_OTHER)
Admission: EM | Admit: 2020-03-14 | Discharge: 2020-03-14 | Disposition: A | Discharge status: Home / Self Care | Payer: Medicare HMO | Attending: Emergency Medicine | Admitting: Emergency Medicine

## 2020-03-14 ENCOUNTER — Encounter (HOSPITAL_BASED_OUTPATIENT_CLINIC_OR_DEPARTMENT_OTHER): Payer: Self-pay | Admitting: Emergency Medicine

## 2020-03-14 DIAGNOSIS — E039 Hypothyroidism, unspecified: Secondary | ICD-10-CM | POA: Diagnosis not present

## 2020-03-14 DIAGNOSIS — Z79899 Other long term (current) drug therapy: Secondary | ICD-10-CM | POA: Diagnosis not present

## 2020-03-14 DIAGNOSIS — Z9049 Acquired absence of other specified parts of digestive tract: Secondary | ICD-10-CM | POA: Diagnosis not present

## 2020-03-14 DIAGNOSIS — R197 Diarrhea, unspecified: Secondary | ICD-10-CM | POA: Diagnosis present

## 2020-03-14 DIAGNOSIS — Z96651 Presence of right artificial knee joint: Secondary | ICD-10-CM | POA: Insufficient documentation

## 2020-03-14 DIAGNOSIS — R6889 Other general symptoms and signs: Secondary | ICD-10-CM

## 2020-03-14 DIAGNOSIS — Z20822 Contact with and (suspected) exposure to covid-19: Secondary | ICD-10-CM | POA: Diagnosis not present

## 2020-03-14 DIAGNOSIS — N3000 Acute cystitis without hematuria: Secondary | ICD-10-CM | POA: Insufficient documentation

## 2020-03-14 DIAGNOSIS — Z7982 Long term (current) use of aspirin: Secondary | ICD-10-CM | POA: Insufficient documentation

## 2020-03-14 LAB — SARS CORONAVIRUS 2 (TAT 6-24 HRS): SARS Coronavirus 2: NEGATIVE

## 2020-03-14 MED ORDER — CEPHALEXIN 500 MG PO CAPS: 500.0000 mg | ORAL_CAPSULE | Freq: Four times a day (QID) | ORAL | 0 refills | Status: DC

## 2020-03-14 MED ORDER — SODIUM CHLORIDE 0.9 % IV SOLN
1.0000 g | Freq: Once | INTRAVENOUS | Status: AC
  Administered 2020-03-14: 1 g via INTRAVENOUS
  Filled 2020-03-14: qty 10

## 2020-03-14 MED FILL — CEPHALEXIN 500 MG CAPSULE: 500 | 7 days supply | Qty: 28 | Fill #0

## 2020-03-14 NOTE — ED Triage Notes (Signed)
Pt with fever, chills, body aches, weakness, headache, diarrhea since sat. Pt was seen [facility] yesterday had negative flu and covid test.

## 2020-03-14 NOTE — Discharge Instructions (Addendum)
Return for any new or worse symptoms.  Chest x-ray today was normal.  Symptoms could be flulike in nature we are aware that she had negative flu test as well as a negative point-of-care Covid test.  Formal Covid testing done here again today.  Labs were reviewed from yesterday.  Urine definitely had positive nitrites suggestive of urinary tract infection as we discussed this may not represent all of your symptoms.  Take the antibiotic Keflex as directed.  If you improve on the antibiotics and the urinary tract infection was probably playing a significant role.  Either way finish out the course of the antibiotics.  Otherwise symptomatic treatment Tylenol Motrin hydrate yourself well.  Rest.  I would assume that symptoms could be consistent with COVID-19 infection even though you have had the vaccine.

## 2020-03-14 NOTE — ED Provider Notes (Addendum)
[place] EMERGENCY DEPARTMENT Provider Note   CSN: [id number] Arrival date & time: 03/14/20  8469     History Chief Complaint  Patient presents with  . Fever    Natalie Sanders is a 69 y.o. female.  Patient with onset of chills body aches headache followed by watery diarrhea mostly in the evening starting on Saturday.  Patient saw her primary care provider yesterday had CBC complete metabolic panel urinalysis influenza test and point-of-care Covid test.  All of these were negative.  Her white count was normal.  Urinalysis had positive nitrite.  Patient denies any dysuria.  Does have some back pain but that seems to be more with the body aches.  No real upper respiratory symptoms no cough no congestion.  Patient does have fatigue and low energy.  Does not feel short of breath.  Patient did have the [place] test had her second 1 on February 6.  Also did have the flu vaccine in the fall.  Patient's had only one episode of vomiting.  Not currently nauseated.        Past Medical History:  Diagnosis Date  . Arthritis   . Complication of anesthesia    hypotension after cholecystectomy  . Difficulty swallowing pills    especially large pills  . 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 Full dentures   . GERD (gastroesophageal reflux disease)   . High cholesterol   . History of hiatal hernia   . Hypothyroidism   . Primary localized osteoarthritis of right knee 06/15/2018    Patient Active Problem List   Diagnosis Date Noted  . Primary localized osteoarthritis of right knee 06/15/2018  . S/P knee replacement 06/15/2018  . S/P total knee arthroplasty 06/15/2018  . Herniated disc, cervical 04/16/2017    Past Surgical History:  Procedure Laterality Date  . ABDOMINAL HYSTERECTOMY     partial  . ANKLE RECONSTRUCTION Left 02/23/2014   Procedure: LEFT FIFTH BASE EXOSTOSIS/PERONEUS BREVIS REPAIR;  Surgeon: Wylene Simmer, MD;  Location: [facility];  Service: Orthopedics;  Laterality: Left;  . ANTERIOR CERVICAL DECOMP/DISCECTOMY FUSION N/A 04/16/2017   Procedure: ANTERIOR CERVICAL DECOMPRESSION/FUSION CERVICAL FIVE-SIX, CERVICAL SIX-SEVEN;  Surgeon: Earnie Larsson, MD;  Location: [facility];  Service: Neurosurgery;  Laterality: N/A;  right side approach  . APPENDECTOMY    . CHOLECYSTECTOMY  2005  . ESOPHAGOGASTRODUODENOSCOPY ENDOSCOPY  03/2017  . IR ESOPHAGUS DILITATION RETRO FLUORO    . TOTAL KNEE ARTHROPLASTY Right 06/15/2018   Procedure: RIGHT TOTAL KNEE ARTHROPLASTY;  Surgeon: Marchia Bond, MD;  Location: [facility];  Service: Orthopedics;  Laterality: Right;     OB History   No obstetric history on file.     No family history on file.  Social History   Tobacco Use  . Smoking status: Former Smoker    Quit date: 12/07/2006    Years since quitting: 13.2  . Smokeless tobacco: Never Used  Substance Use Topics  . Alcohol use: No  . Drug use: No    Home Medications Prior to Admission medications   Medication Sig Start Date End Date Taking? Authorizing Provider  acetaminophen (TYLENOL) 500 MG tablet Take 1,000 mg by mouth 2 (two) times daily as needed for moderate pain.    [provider]  aspirin EC 325 MG tablet Take 1 tablet (325 mg total) by mouth daily. 06/15/18   Marchia Bond, MD  baclofen (LIORESAL) 10 MG  tablet Take 1 tablet (10 mg total) by mouth 3 (three) times daily. As needed for muscle spasm 06/15/18   Teryl Lucy, MD  cephALEXin (KEFLEX) 500 MG capsule Take 1 capsule (500 mg total) by mouth 4 (four) times daily. 03/14/20   Vanetta Mulders, MD  levothyroxine (SYNTHROID) 75 MCG tablet Take 75 mcg by mouth daily before breakfast.     [provider]  meloxicam (MOBIC) 7.5 MG tablet Take 7.5 mg by mouth every evening.     [provider]  ondansetron (ZOFRAN) 4 MG tablet Take 1 tablet (4 mg total) by mouth every 8 (eight) hours as needed for nausea or  vomiting. 06/15/18   Teryl Lucy, MD  oxyCODONE (ROXICODONE) 5 MG immediate release tablet Take 1 tablet (5 mg total) by mouth every 4 (four) hours as needed for severe pain. 06/15/18   Teryl Lucy, MD  pantoprazole (PROTONIX) 40 MG tablet Take 40 mg by mouth every evening.  02/13/17   [provider]  sennosides-docusate sodium (SENOKOT-S) 8.6-50 MG tablet Take 2 tablets by mouth daily. 06/15/18   Teryl Lucy, MD  simvastatin (ZOCOR) 40 MG tablet Take 40 mg by mouth at bedtime.  03/10/17   [provider]  Soft Lens Products (OPTI-FREE REWETTING DROPS) SOLN Place 1 drop into both eyes daily as needed (dry eyes).    [provider]    Allergies    Methylprednisolone, Darvon [propoxyphene], Liraglutide, Lorcaserin, and Naltrexone-bupropion hcl er  Review of Systems   Review of Systems  Constitutional: Positive for activity change, chills, fatigue and fever.  HENT: Negative for congestion, rhinorrhea and sore throat.   Eyes: Negative for visual disturbance.  Respiratory: Negative for cough and shortness of breath.   Cardiovascular: Negative for chest pain and leg swelling.  Gastrointestinal: Positive for diarrhea and vomiting. Negative for abdominal pain and nausea.  Genitourinary: Negative for dysuria.  Musculoskeletal: Positive for myalgias. Negative for back pain and neck pain.  Skin: Negative for rash.  Neurological: Positive for headaches. Negative for dizziness and light-headedness.  Hematological: Does not bruise/bleed easily.  Psychiatric/Behavioral: Negative for confusion.    Physical Exam Updated Vital Signs BP 137/65   Pulse 90   Temp 98.9 F (37.2 C) (Oral)   Resp 14   Ht 1.626 m (5\' 4" )   Wt 99.8 kg   SpO2 95% Comment: rm air  BMI 37.76 kg/m   Physical Exam Vitals and nursing note reviewed.  Constitutional:      General: She is not in acute distress.    Appearance: Normal appearance. She is well-developed.  HENT:     Head:  Normocephalic and atraumatic.  Eyes:     Extraocular Movements: Extraocular movements intact.     Conjunctiva/sclera: Conjunctivae normal.     Pupils: Pupils are equal, round, and reactive to light.  Cardiovascular:     Rate and Rhythm: Normal rate and regular rhythm.     Heart sounds: No murmur.  Pulmonary:     Effort: Pulmonary effort is normal. No respiratory distress.     Breath sounds: Normal breath sounds.  Abdominal:     Palpations: Abdomen is soft.     Tenderness: There is no abdominal tenderness.  Musculoskeletal:        General: Normal range of motion.     Cervical back: Normal range of motion and neck supple.  Skin:    General: Skin is warm and dry.  Neurological:     General: No focal deficit present.  Mental Status: She is alert and oriented to person, place, and time.     ED Results / Procedures / Treatments   Labs (all labs ordered are listed, but only abnormal results are displayed) Labs Reviewed  SARS CORONAVIRUS 2 (TAT 6-24 HRS)    EKG None  Radiology DG Chest Port 1 View  Result Date: 03/14/2020 CLINICAL DATA:  Fever. Nausea, vomiting, and diarrhea. EXAM: PORTABLE CHEST 1 VIEW COMPARISON:  03/30/2017 FINDINGS: The heart size and pulmonary vascularity are normal. Aortic atherosclerosis. The lungs are clear. No acute bone abnormality. IMPRESSION: No acute abnormality. Aortic Atherosclerosis (ICD10-I70.0). Electronically Signed   By: Francene Boyers M.D.   On: 03/14/2020 08:09    Procedures Procedures (including critical care time)  Medications Ordered in ED Medications  cefTRIAXone (ROCEPHIN) 1 g in sodium chloride 0.9 % 100 mL IVPB (1 g Intravenous New Bag/Given 03/14/20 6433)    ED Course  I have reviewed the triage vital signs and the nursing notes.  Pertinent labs & imaging results that were available during my care of the patient were reviewed by me and considered in my medical decision making (see chart for details).    MDM  Rules/Calculators/A&P                     Patient symptoms very suggestive of flulike illness.  However her point-of-care influenza AMB was negative.  As well as her Covid was negative.  We will do formal Covid testing because her symptoms are very suggestive of COVID-19 infection.  Patient's oxygen saturations 95%.  Heart rate is in the 90s.  Patient nontoxic no acute distress at this time.  Labs yesterday without any significant abnormalities including complete metabolic panel and CBC.  Urinalysis was nitrite positive.  Possibility that some of the symptoms could be related to urinary tract infection.  But not sure that all symptoms are related to that.  Chest x-ray here today was negative.  We will go ahead and treat with 1 g of Rocephin to cover the positive nitrite urine from yesterday.  And we will treat her at home with Keflex.  If she improves on this then the urinary tract infection which may have developed into an upper tract infection may explain the symptoms.  Otherwise symptoms very flulike.  Formal Covid testing has been ordered as well.  Patient should be safe for discharge home taken the Keflex returning for any new or worse symptoms at all.    Final Clinical Impression(s) / ED Diagnoses Final diagnoses:  Flu-like symptoms  Acute cystitis without hematuria    Rx / DC Orders ED Discharge Orders         Ordered    cephALEXin (KEFLEX) 500 MG capsule  4 times daily     03/14/20 0837           Vanetta Mulders, MD 03/14/20 0830    Vanetta Mulders, MD 03/14/20 [id number]

## 2021-04-17 IMAGING — DX DG CHEST 1V PORT
1 series · 1 of 1 positions shown · non-contrast
Comparison: 03/30/2017

CLINICAL DATA: Fever. Nausea, vomiting, and diarrhea.

EXAM:
PORTABLE CHEST 1 VIEW

[chest ap]
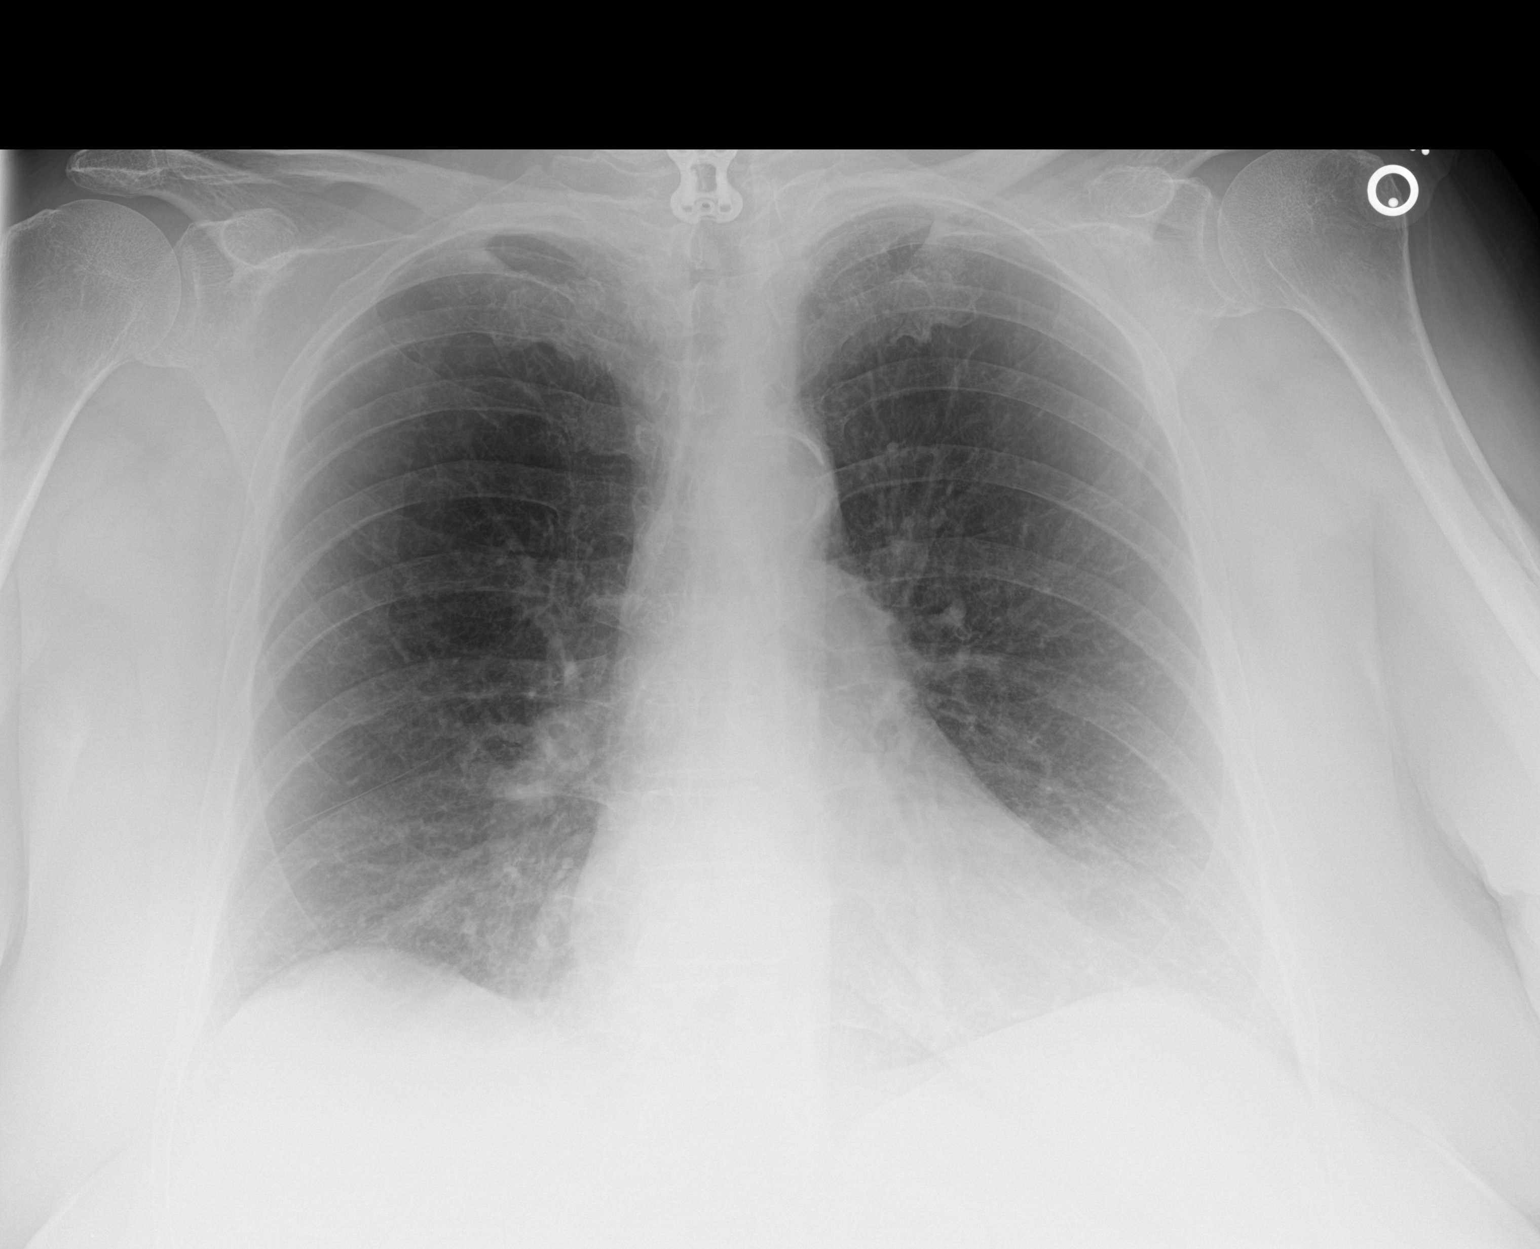

[1 of 1 positions shown; findings below may reference images not displayed]

FINDINGS: The heart size and pulmonary vascularity are normal. Aortic
atherosclerosis.

The lungs are clear. No acute bone abnormality.
IMPRESSION: No acute abnormality.

Aortic Atherosclerosis ([id number]).

## 2023-10-14 ENCOUNTER — Ambulatory Visit: Payer: Medicare HMO | Admitting: Orthopaedic Surgery

## 2024-05-23 ENCOUNTER — Other Ambulatory Visit (INDEPENDENT_AMBULATORY_CARE_PROVIDER_SITE_OTHER): Payer: Self-pay

## 2024-05-23 ENCOUNTER — Ambulatory Visit (INDEPENDENT_AMBULATORY_CARE_PROVIDER_SITE_OTHER): Admitting: Orthopaedic Surgery

## 2024-05-23 ENCOUNTER — Encounter: Payer: Self-pay | Admitting: Orthopaedic Surgery

## 2024-05-23 VITALS — Ht 64.0 in | Wt 213.0 lb

## 2024-05-23 DIAGNOSIS — M25551 Pain in right hip: Secondary | ICD-10-CM

## 2024-05-23 NOTE — Progress Notes (Signed)
 The patient is a 73 year old female that I am seeing for the first time as a relates to severe right hip pain and arthritis.  She has a history of a right total knee arthroplasty and says she does need a knee replacement her left knee however she does have severe right hip and groin pain.  This got worse after a fall last September on a cruise ship.  She has had at least 2 steroid injections in her right hip and the first 1 helped quite a bit but the last one did not really help long.  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  She sees a pulmonologist on a regular basis and she is scheduled to see her pulmonologist in mid July and she said she may be able to come off of blood thinning medication completely.  She is someone who is a BMI of 36.56.  I was able to review her past medical history and medications within epic.  She is not a diabetic.  On exam her right hip has significant pain with any attempts of internal and external rotation as well as severe pain in the groin with rotation.  The left hip moves smoothly.  An AP pelvis and lateral right hip shows bone-on-bone wear of the right hip with loss of the joint space and osteophytes around the right hip joint.  We had a long and thorough discussion about hip replacement surgery.  If she is able to come off of Xarelto that would be great.  If not we would need her to stop this 3 days before surgery.  We would likely still put her on Xarelto after surgery given her history of DVT and PE.  I gave her hand about hip replacement surgery and went over the x-rays with her.  We discussed the risks and benefits of this type of surgery and what to expect from an intraoperative and postoperative standpoint.  All questions and concerns were answered and addressed.  Will be in touch with scheduling her for right total hip replacement.

## 2024-05-24 ENCOUNTER — Other Ambulatory Visit: Payer: Self-pay | Admitting: Orthopaedic Surgery

## 2024-05-24 MED ORDER — TRAMADOL HCL 50 MG PO TABS: 50.0000 mg | ORAL_TABLET | Freq: Two times a day (BID) | ORAL | 0 refills | Status: DC | PRN

## 2024-05-24 NOTE — Telephone Encounter (Signed)
 Called patient. Stated she understood. She will try the tramadol  for now.

## 2024-06-03 ENCOUNTER — Telehealth: Payer: Self-pay

## 2024-06-03 NOTE — Telephone Encounter (Signed)
 I called patient to discuss scheduling surgery.  Left voice mail message for return call.

## 2024-07-11 ENCOUNTER — Other Ambulatory Visit: Payer: Self-pay | Admitting: Physician Assistant

## 2024-07-11 DIAGNOSIS — Z01818 Encounter for other preprocedural examination: Secondary | ICD-10-CM

## 2024-07-22 NOTE — Pre-Procedure Instructions (Signed)
 Surgical Instructions   Your procedure is scheduled on August 02, 2024. Report to [facility] Main Entrance A at 6:45 A.M., then check in with the Admitting office. Any questions or running late day of surgery: call [phone]  Questions prior to your surgery date: call [phone], Monday-Friday, 8am-4pm. If you experience any cold or flu symptoms such as cough, fever, chills, shortness of breath, etc. between now and your scheduled surgery, please notify us  at the above number.     Remember:  Do not eat after midnight the night before your surgery  You may drink clear liquids until 5:45 AM the morning of your surgery.   Clear liquids allowed are: Water , Non-Citrus Juices (without pulp), Carbonated Beverages, Clear Tea (no milk, honey, etc.), Black Coffee Only (NO MILK, CREAM OR POWDERED CREAMER of any kind), and Gatorade.    Take these medicines the morning of surgery with A SIP OF WATER : cephALEXin  (KEFLEX )  levothyroxine  (SYNTHROID )    May take these medicines IF NEEDED: acetaminophen  (TYLENOL )  HYDROcodone -acetaminophen  (NORCO/VICODIN)  Soft Lens Products (OPTI-FREE REWETTING DROPS) eye drops   STOP taking your XARELTO three days prior to surgery. Your last dose will be August 22nd.    One week prior to surgery, STOP taking any Aspirin  (unless otherwise instructed by your surgeon) Aleve , Naproxen , Ibuprofen , Motrin , Advil , Goody's, BC's, all herbal medications, fish oil, and non-prescription vitamins.                     Do NOT Smoke (Tobacco/Vaping) for 24 hours prior to your procedure.  If you use a CPAP at night, you may bring your mask/headgear for your overnight stay.   You will be asked to remove any contacts, glasses, piercing's, hearing aid's, dentures/partials prior to surgery. Please bring cases for these items if needed.    Patients discharged the day of surgery will not be allowed to drive home, and someone needs to stay with them for 24  hours.  SURGICAL WAITING ROOM VISITATION Patients may have no more than 2 support people in the waiting area - these visitors may rotate.   Pre-op nurse will coordinate an appropriate time for 1 ADULT support person, who may not rotate, to accompany patient in pre-op.  Children under the age of 3 must have an adult with them who is not the patient and must remain in the main waiting area with an adult.  If the patient needs to stay at the hospital during part of their recovery, the visitor guidelines for inpatient rooms apply.  Please refer to the [facility] website for the visitor guidelines for any additional information.   If you received a COVID test during your pre-op visit  it is requested that you wear a mask when out in public, stay away from anyone that may not be feeling well and notify your surgeon if you develop symptoms. If you have been in contact with anyone that has tested positive in the last 10 days please notify you surgeon.      Pre-operative 5 CHG Bathing Instructions   You can play a key role in reducing the risk of infection after surgery. Your skin needs to be as free of germs as possible. You can reduce the number of germs on your skin by washing with CHG (chlorhexidine  gluconate) soap before surgery. CHG is an antiseptic soap that kills germs and continues to kill germs even after washing.   DO NOT use if you have an allergy to chlorhexidine /CHG or  antibacterial soaps. If your skin becomes reddened or irritated, stop using the CHG and notify one of our RNs at [phone].   Please shower with the CHG soap starting 4 days before surgery using the following schedule:     Please keep in mind the following:  DO NOT shave, including legs and underarms, starting the day of your first shower.   You may shave your face at any point before/day of surgery.  Place clean sheets on your bed the day you start using CHG soap. Use a clean washcloth (not used since being  washed) for each shower. DO NOT sleep with pets once you start using the CHG.   CHG Shower Instructions:  Wash your face and private area with normal soap. If you choose to wash your hair, wash first with your normal shampoo.  After you use shampoo/soap, rinse your hair and body thoroughly to remove shampoo/soap residue.  Turn the water  OFF and apply about 3 tablespoons (45 ml) of CHG soap to a CLEAN washcloth.  Apply CHG soap ONLY FROM YOUR NECK DOWN TO YOUR TOES (washing for 3-5 minutes)  DO NOT use CHG soap on face, private areas, open wounds, or sores.  Pay special attention to the area where your surgery is being performed.  If you are having back surgery, having someone wash your back for you may be helpful. Wait 2 minutes after CHG soap is applied, then you may rinse off the CHG soap.  Pat dry with a clean towel  Put on clean clothes/pajamas   If you choose to wear lotion, please use ONLY the CHG-compatible lotions that are listed below.  Additional instructions for the day of surgery: DO NOT APPLY any lotions, deodorants, cologne, or perfumes.   Do not bring valuables to the hospital. [facility] is not responsible for any belongings/valuables. Do not wear nail polish, gel polish, artificial nails, or any other type of covering on natural nails (fingers and toes) Do not wear jewelry or makeup Put on clean/comfortable clothes.  Please brush your teeth.  Ask your nurse before applying any prescription medications to the skin.     CHG Compatible Lotions   Aveeno Moisturizing lotion  Cetaphil Moisturizing Cream  Cetaphil Moisturizing Lotion  Clairol Herbal Essence Moisturizing Lotion, Dry Skin  Clairol Herbal Essence Moisturizing Lotion, Extra Dry Skin  Clairol Herbal Essence Moisturizing Lotion, Normal Skin  Curel Age Defying Therapeutic Moisturizing Lotion with Alpha Hydroxy  Curel Extreme Care Body Lotion  Curel Soothing Hands Moisturizing Hand Lotion  Curel Therapeutic  Moisturizing Cream, Fragrance-Free  Curel Therapeutic Moisturizing Lotion, Fragrance-Free  Curel Therapeutic Moisturizing Lotion, Original Formula  Eucerin Daily Replenishing Lotion  Eucerin Dry Skin Therapy Plus Alpha Hydroxy Crme  Eucerin Dry Skin Therapy Plus Alpha Hydroxy Lotion  Eucerin Original Crme  Eucerin Original Lotion  Eucerin Plus Crme Eucerin Plus Lotion  Eucerin TriLipid Replenishing Lotion  Keri Anti-Bacterial Hand Lotion  Keri Deep Conditioning Original Lotion Dry Skin Formula Softly Scented  Keri Deep Conditioning Original Lotion, Fragrance Free Sensitive Skin Formula  Keri Lotion Fast Absorbing Fragrance Free Sensitive Skin Formula  Keri Lotion Fast Absorbing Softly Scented Dry Skin Formula  Keri Original Lotion  Keri Skin Renewal Lotion Keri Silky Smooth Lotion  Keri Silky Smooth Sensitive Skin Lotion  Nivea Body Creamy Conditioning Oil  Nivea Body Extra Enriched Teacher, adult education Moisturizing Lotion Nivea Crme  Nivea Skin Firming Lotion  NutraDerm 30 Skin Lotion  NutraDerm Skin  Lotion  NutraDerm Therapeutic Skin Cream  NutraDerm Therapeutic Skin Lotion  ProShield Protective Hand Cream  Provon moisturizing lotion  Please read over the following fact sheets that you were given.

## 2024-07-25 ENCOUNTER — Encounter (HOSPITAL_COMMUNITY): Payer: Self-pay

## 2024-07-25 ENCOUNTER — Other Ambulatory Visit: Payer: Self-pay

## 2024-07-25 ENCOUNTER — Encounter (HOSPITAL_COMMUNITY)
Admission: RE | Admit: 2024-07-25 | Discharge: 2024-07-25 | Disposition: A | Discharge status: Home / Self Care | Source: Ambulatory Visit | Attending: Orthopaedic Surgery | Admitting: Orthopaedic Surgery

## 2024-07-25 DIAGNOSIS — E785 Hyperlipidemia, unspecified: Secondary | ICD-10-CM | POA: Diagnosis not present

## 2024-07-25 DIAGNOSIS — Z86711 Personal history of pulmonary embolism: Secondary | ICD-10-CM | POA: Diagnosis not present

## 2024-07-25 DIAGNOSIS — Z86718 Personal history of other venous thrombosis and embolism: Secondary | ICD-10-CM | POA: Diagnosis not present

## 2024-07-25 DIAGNOSIS — Z01818 Encounter for other preprocedural examination: Secondary | ICD-10-CM

## 2024-07-25 DIAGNOSIS — E039 Hypothyroidism, unspecified: Secondary | ICD-10-CM | POA: Diagnosis not present

## 2024-07-25 DIAGNOSIS — J439 Emphysema, unspecified: Secondary | ICD-10-CM | POA: Insufficient documentation

## 2024-07-25 DIAGNOSIS — R000 Tachycardia, unspecified: Secondary | ICD-10-CM | POA: Insufficient documentation

## 2024-07-25 DIAGNOSIS — M1611 Unilateral primary osteoarthritis, right hip: Secondary | ICD-10-CM | POA: Insufficient documentation

## 2024-07-25 DIAGNOSIS — Z7901 Long term (current) use of anticoagulants: Secondary | ICD-10-CM | POA: Diagnosis not present

## 2024-07-25 DIAGNOSIS — Z01812 Encounter for preprocedural laboratory examination: Secondary | ICD-10-CM | POA: Diagnosis present

## 2024-07-25 HISTORY — DX: Urinary tract infection, site not specified: N39.0

## 2024-07-25 LAB — CBC
HCT: 47.4 % — ABNORMAL HIGH (ref 36.0–46.0)
Hemoglobin: 15.3 g/dL — ABNORMAL HIGH (ref 12.0–15.0)
MCH: 29.9 pg (ref 26.0–34.0)
MCHC: 32.3 g/dL (ref 30.0–36.0)
MCV: 92.6 fL (ref 80.0–100.0)
Platelets: 212 K/uL (ref 150–400)
RBC: 5.12 MIL/uL — ABNORMAL HIGH (ref 3.87–5.11)
RDW: 13.5 % (ref 11.5–15.5)
WBC: 8.1 K/uL (ref 4.0–10.5)
nRBC: 0 % (ref 0.0–0.2)

## 2024-07-25 LAB — TYPE AND SCREEN
ABO/RH(D): A NEG
Antibody Screen: NEGATIVE

## 2024-07-25 LAB — SURGICAL PCR SCREEN
MRSA, PCR: NEGATIVE
Staphylococcus aureus: NEGATIVE

## 2024-07-25 LAB — BASIC METABOLIC PANEL WITH GFR
Anion gap: 9 (ref 5–15)
BUN: 8 mg/dL (ref 8–23)
CO2: 28 mmol/L (ref 22–32)
Calcium: 9 mg/dL (ref 8.9–10.3)
Chloride: 102 mmol/L (ref 98–111)
Creatinine, Ser: 0.78 mg/dL (ref 0.44–1.00)
GFR, Estimated: >60 mL/min (ref >60)
Glucose, Bld: 83 mg/dL (ref 70–99)
Potassium: 3.7 mmol/L (ref 3.5–5.1)
Sodium: 139 mmol/L (ref 135–145)

## 2024-07-25 NOTE — Progress Notes (Signed)
 Surgical Instructions     Your procedure is scheduled on August 02, 2024. Report to [facility] Main Entrance A at 6:45 A.M., then check in with the Admitting office. Any questions or running late day of surgery: call [phone]   Questions prior to your surgery date: call [phone], Monday-Friday, 8am-4pm. If you experience any cold or flu symptoms such as cough, fever, chills, shortness of breath, etc. between now and your scheduled surgery, please notify us  at the above number.            Remember:       Do not eat after midnight the night before your surgery   You may drink clear liquids until 5:45 AM the morning of your surgery.   Clear liquids allowed are: Water , Non-Citrus Juices (without pulp), Carbonated Beverages, Clear Tea (no milk, honey, etc.), Black Coffee Only (NO MILK, CREAM OR POWDERED CREAMER of any kind), and Gatorade.          Patient Instructions  The night before surgery:  No food after midnight. ONLY clear liquids after midnight  The day of surgery (if you do NOT have diabetes):  Drink ONE (1) Pre-Surgery Clear Ensure by 5:45 the morning of surgery. Drink in one sitting. Do not sip.  This drink was given to you during your hospital pre-op appointment visit.  Nothing else to drink after completing the Pre-Surgery Clear Ensure.         If you have questions, please contact your surgeon's office.   Take these medicines the morning of surgery with A SIP OF WATER : cephALEXin  (KEFLEX )  levothyroxine  (SYNTHROID )      May take these medicines IF NEEDED: acetaminophen  (TYLENOL )  HYDROcodone -acetaminophen  (NORCO/VICODIN)  Soft Lens Products (OPTI-FREE REWETTING DROPS) eye drops     STOP taking your XARELTO three days prior to surgery. Your last dose will be August 22nd.      One week prior to surgery, STOP taking any Aspirin  (unless otherwise instructed by your surgeon) Aleve , Naproxen , Ibuprofen , Motrin , Advil , Goody's, BC's, all herbal medications,  fish oil, and non-prescription vitamins.                      Do NOT Smoke (Tobacco/Vaping) for 24 hours prior to your procedure.   If you use a CPAP at night, you may bring your mask/headgear for your overnight stay.   You will be asked to remove any contacts, glasses, piercing's, hearing aid's, dentures/partials prior to surgery. Please bring cases for these items if needed.    Patients discharged the day of surgery will not be allowed to drive home, and someone needs to stay with them for 24 hours.   SURGICAL WAITING ROOM VISITATION Patients may have no more than 2 support people in the waiting area - these visitors may rotate.   Pre-op nurse will coordinate an appropriate time for 1 ADULT support person, who may not rotate, to accompany patient in pre-op.  Children under the age of 86 must have an adult with them who is not the patient and must remain in the main waiting area with an adult.   If the patient needs to stay at the hospital during part of their recovery, the visitor guidelines for inpatient rooms apply.   Please refer to the [facility] website for the visitor guidelines for any additional information.     If you received a COVID test during your pre-op visit  it is requested that you wear a mask when out  in public, stay away from anyone that may not be feeling well and notify your surgeon if you develop symptoms. If you have been in contact with anyone that has tested positive in the last 10 days please notify you surgeon.         Pre-operative 5 CHG Bathing Instructions    You can play a key role in reducing the risk of infection after surgery. Your skin needs to be as free of germs as possible. You can reduce the number of germs on your skin by washing with CHG (chlorhexidine  gluconate) soap before surgery. CHG is an antiseptic soap that kills germs and continues to kill germs even after washing.    DO NOT use if you have an allergy to chlorhexidine /CHG or  antibacterial soaps. If your skin becomes reddened or irritated, stop using the CHG and notify one of our RNs at [phone].    Please shower with the CHG soap starting 4 days before surgery using the following schedule:       Please keep in mind the following:  DO NOT shave, including legs and underarms, starting the day of your first shower.   You may shave your face at any point before/day of surgery.  Place clean sheets on your bed the day you start using CHG soap. Use a clean washcloth (not used since being washed) for each shower. DO NOT sleep with pets once you start using the CHG.    CHG Shower Instructions:  Wash your face and private area with normal soap. If you choose to wash your hair, wash first with your normal shampoo.  After you use shampoo/soap, rinse your hair and body thoroughly to remove shampoo/soap residue.  Turn the water  OFF and apply about 3 tablespoons (45 ml) of CHG soap to a CLEAN washcloth.  Apply CHG soap ONLY FROM YOUR NECK DOWN TO YOUR TOES (washing for 3-5 minutes)  DO NOT use CHG soap on face, private areas, open wounds, or sores.  Pay special attention to the area where your surgery is being performed.  If you are having back surgery, having someone wash your back for you may be helpful. Wait 2 minutes after CHG soap is applied, then you may rinse off the CHG soap.  Pat dry with a clean towel  Put on clean clothes/pajamas   If you choose to wear lotion, please use ONLY the CHG-compatible lotions that are listed below.   Additional instructions for the day of surgery: DO NOT APPLY any lotions, deodorants, cologne, or perfumes.   Do not bring valuables to the hospital. [facility] is not responsible for any belongings/valuables. Do not wear nail polish, gel polish, artificial nails, or any other type of covering on natural nails (fingers and toes) Do not wear jewelry or makeup Put on clean/comfortable clothes.  Please brush your teeth.  Ask your  nurse before applying any prescription medications to the skin.        CHG Compatible Lotions    Aveeno Moisturizing lotion  Cetaphil Moisturizing Cream  Cetaphil Moisturizing Lotion  Clairol Herbal Essence Moisturizing Lotion, Dry Skin  Clairol Herbal Essence Moisturizing Lotion, Extra Dry Skin  Clairol Herbal Essence Moisturizing Lotion, Normal Skin  Curel Age Defying Therapeutic Moisturizing Lotion with Alpha Hydroxy  Curel Extreme Care Body Lotion  Curel Soothing Hands Moisturizing Hand Lotion  Curel Therapeutic Moisturizing Cream, Fragrance-Free  Curel Therapeutic Moisturizing Lotion, Fragrance-Free  Curel Therapeutic Moisturizing Lotion, Original Formula  Eucerin Daily Replenishing Lotion  Eucerin Dry  Skin Therapy Plus Alpha Hydroxy Crme  Eucerin Dry Skin Therapy Plus Alpha Hydroxy Lotion  Eucerin Original Crme  Eucerin Original Lotion  Eucerin Plus Crme Eucerin Plus Lotion  Eucerin TriLipid Replenishing Lotion  Keri Anti-Bacterial Hand Lotion  Keri Deep Conditioning Original Lotion Dry Skin Formula Softly Scented  Keri Deep Conditioning Original Lotion, Fragrance Free Sensitive Skin Formula  Keri Lotion Fast Absorbing Fragrance Free Sensitive Skin Formula  Keri Lotion Fast Absorbing Softly Scented Dry Skin Formula  Keri Original Lotion  Keri Skin Renewal Lotion Keri Silky Smooth Lotion  Keri Silky Smooth Sensitive Skin Lotion  Nivea Body Creamy Conditioning Oil  Nivea Body Extra Enriched Lotion  Nivea Body Original Lotion  Nivea Body Sheer Moisturizing Lotion Nivea Crme  Nivea Skin Firming Lotion  NutraDerm 30 Skin Lotion  NutraDerm Skin Lotion  NutraDerm Therapeutic Skin Cream  NutraDerm Therapeutic Skin Lotion  ProShield Protective Hand Cream  Provon moisturizing lotion   Please read over the following fact sheets that you were given

## 2024-07-25 NOTE — Progress Notes (Signed)
 PCP - Dr. Truman Romney Cardiologist - Denies Pulmologist - Nat Skelton LOV 06-21-24 - clearance on 06-30-24   PPM/ICD - Denies Device Orders - n/a Rep Notified - n/a  Chest x-ray - N/A EKG - 12-22-23 [facility] - requested documents on 07-25-24 Stress Test - Denies ECHO - 07-11-24 [facility] Cardiac Cath - Denies PFT's - 07-06-24  Sleep Study - Denies CPAP - n/a  Fasting Blood Sugar - Denies Checks Blood Sugar _____ times a day: N/A  Last dose of GLP1 agonist-  Denies GLP1 instructions: n/a  Blood Thinner Instructions: Coumadin - Hold 3 days - last dose on 8/22 Aspirin  Instructions: Denies  ERAS Protcol - Clears until 5:45 PRE-SURGERY Ensure or G2- Ensure  COVID TEST- n/a   Anesthesia review: Yes, DVT/PE w/ Right heart strain on 12-28-23.  Patient denies shortness of breath, fever, cough and chest pain at PAT appointment. Patient states she has no respiratory issues at the moment.   All instructions explained to the patient, with a verbal understanding of the material. Patient agrees to go over the instructions while at home for a better understanding. Patient also instructed to self quarantine after being tested for COVID-19. The opportunity to ask questions was provided.

## 2024-07-26 NOTE — Anesthesia Preprocedure Evaluation (Addendum)
 Anesthesia Evaluation  Patient identified by MRN, date of birth, ID band Patient awake    Reviewed: Allergy & Precautions, NPO status , Patient's Chart, lab work & pertinent test results, reviewed documented beta blocker date and time   History of Anesthesia Complications (+) history of anesthetic complications  Airway Mallampati: III  TM Distance: >3 FB     Dental  (+) Upper Dentures, Lower Dentures   Pulmonary neg COPD, former smoker, PE On xarelto  for PE   breath sounds clear to auscultation       Cardiovascular (-) angina (-) CAD and (-) Past MI  Rhythm:Regular Rate:Normal  Left Ventricle: Left ventricle size is normal.   Left Ventricle: Wall thickness is normal.   Left Ventricle: EF: 60-65%. Quantitative analysis of left ventricular Global Longitudinal Strain (GLS) imaging is -16.4%.   Left Ventricle: Wall motion is normal.   Left Ventricle: Doppler parameters consistent with mild diastolic dysfunction and low to normal LA pressure.   Right Ventricle: Right ventricle size is normal.   Right Ventricle: Systolic function is normal.   Tricuspid Valve: There is trace regurgitation.   Tricuspid Valve: The right ventricular systolic pressure is normal (<36 mmHg).   Aorta: The aortic root is normal in size. The ascending aorta is normal in size.   Pericardium: There is no pericardial effusion.     Neuro/Psych neg Seizures    GI/Hepatic hiatal hernia,GERD  ,,  Endo/Other  Hypothyroidism    Renal/GU      Musculoskeletal  (+) Arthritis ,    Abdominal   Peds  Hematology   Anesthesia Other Findings   Reproductive/Obstetrics                              Anesthesia Physical Anesthesia Plan  ASA: 2  Anesthesia Plan: Spinal   Post-op Pain Management:    Induction: Intravenous  PONV Risk Score and Plan: 2 and Ondansetron   Airway Management Planned: Natural Airway and  Simple Face Mask  Additional Equipment:   Intra-op Plan:   Post-operative Plan: Extubation in OR  Informed Consent: I have reviewed the patients History and Physical, chart, labs and discussed the procedure including the risks, benefits and alternatives for the proposed anesthesia with the patient or authorized representative who has indicated his/her understanding and acceptance.     Dental advisory given  Plan Discussed with: CRNA  Anesthesia Plan Comments: (PAT note written 07/26/2024 by Allison Zelenak, PA-C.  )         Anesthesia Quick Evaluation

## 2024-07-26 NOTE — Progress Notes (Signed)
 Anesthesia Chart Review:  Case: [id number] Date/Time: 08/02/24 0830   Procedure: ARTHROPLASTY, HIP, TOTAL, ANTERIOR APPROACH (Right: Hip)   Anesthesia type: Spinal   Diagnosis: Primary osteoarthritis of right hip [M16.11]   Pre-op diagnosis: osteoarthritis right hip   [facility] RO[facility] / [facility]   Surgeons: Vernetta Lonni GRADE, MD       DISCUSSION: Patient is a 73 year old female scheduled for the above procedure.  History includes HLD, hypothyroidism, GERD, RLE DVT/PE (right main PE with evidence of right heart strain 12/22/2023, likely provoked from prolonged immobilization post fall; discharged on Xarelto, O2 2L with activity), psoriasis.  She had pulmonology evaluation with Dr. Lenda [facility]) for follow-up PE. Repeat  chest CTA on 06/07/2024 showed small residual intraluminal web distal RUL, otherwise resolution of PE.  Sats 96->92% on 06/21/2024 6 minute walk test. TTE ordered to follow-up pulmonary hypertension, but in the interim wrote, Based on what I am seeing right now I do not think that her pulmonary hypertension (in setting of PE) should preclude her from having surgery. Her functional status is excellent. She has not required any long-term medications for her emphysema that was noted incidentally on CT scan.  Will plan to continue her on blood thinner for a total of 3 additional months given the tiny residual pulmonary embolism seen. This can be held for surgery of course as needed. This was a provoked pulmonary embolism after a traumatic incident to he leg after a traumatic fall.   TTE updated on 07/11/2024 and showed LVEF 60-65%, normal LV wall motion, mild diastolic dysfunction, low to normal LA pressure, normal RV size and function, trace TR, RVSP normal (< 36 mmHg). She gave pulmonology clearance for procedure with recommendation to resume anticoagulation post-operative when able. Last Xarelto planned for 07/29/2024.   Anesthesia team to evaluate on the day of  surgery.   VS: BP (!) 125/56   Pulse 65   Temp 36.7 C   Resp 16   Ht 5' 3 (1.6 m)   Wt 96.2 kg   SpO2 96%   BMI 37.55 kg/m    PROVIDERS: Andrew Truman GRADE., MD is PCP  Lenda Garre, MD is pulmonologist  LABS: Labs reviewed: Acceptable for surgery. (all labs ordered are listed, but only abnormal results are displayed)  Labs Reviewed  CBC - Abnormal; Notable for the following components:      Result Value   RBC 5.12 (*)    Hemoglobin 15.3 (*)    HCT 47.4 (*)    All other components within normal limits  SURGICAL PCR SCREEN  BASIC METABOLIC PANEL WITH GFR  TYPE AND SCREEN    Spirometry 07/06/2024 ([facility]): FEV1 is 1.71L, 83% predicted, FVC is 2.58L, 97% predicted.    TLC is 5.53L, 116% predicted, and diffusion capacity is 52% predicted.  - Overall, this study is consistent with mild obstruction, some air trapping  and a reduced diffusion capacity (moderate).    IMAGES: Xray right hip 05/23/2024: An AP pelvis and lateral right hip shows near bone-on-bone wear of the  right hip with significant joint space narrowing and osteophytes around  the right hip.  The left hip joint space is well-maintained.   CTA Chest 06/07/2024 ([facility]): IMPRESSION:  1. 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 No new PE.  2.  Emphysema.  3.  Enlarged central pulmonary artery consistent with pulmonary artery hypertension.  4.  3 mm juxtapleural left upper  lobe nodule is stable. Follow based on Fleischner criteria.    EKG: EKG 12/22/2023 ([facility], in setting of acute PE): Tracing requested, but per Narrative in [facility]: Diagnosis Sinus tachycardia at 103 bpm Left axis deviation  Low voltage QRS  Inferior infarct (cited on or before 22-Dec-2023)  Cannot rule out Anterior infarct (cited on or before 22-Dec-2023)  Abnormal ECG  When compared with ECG of 22-Dec-2023 19:56,  No significant change was found  Means, Gregory  ([id number]) on 12/23/2023 6:00:49 AM certifies that he/she has reviewed the ECG tracing and confirms the independent  interpretation is  correct.     CV: Echo 07/11/2024 ([facility]): Left Ventricle: Left ventricle size is normal.    Left Ventricle: Wall thickness is normal.    Left Ventricle: EF: 60-65%. Quantitative analysis of left ventricular  Global Longitudinal Strain (GLS) imaging is -16.4%.    Left Ventricle: Wall motion is normal.    Left Ventricle: Doppler parameters consistent with mild diastolic  dysfunction and low to normal LA pressure.    Right Ventricle: Right ventricle size is normal.    Right Ventricle: Systolic function is normal.    Tricuspid Valve: There is trace regurgitation.    Tricuspid Valve: The right ventricular systolic pressure is normal (<36  mmHg).   Aorta: The aortic root is normal in size. The ascending aorta is normal  in size.    Pericardium: There is no pericardial effusion.  - RVSP previously 50-50 mmHg in setting of acute PE with right heart strain 12/23/2023.   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   Frequent UTI    takes keflex  daily   Full dentures    GERD (gastroesophageal reflux disease)    High cholesterol    History of hiatal hernia    Hypothyroidism    Primary localized osteoarthritis of right knee 06/15/2018    Past Surgical History:  Procedure Laterality Date   ABDOMINAL HYSTERECTOMY     partial   ANKLE RECONSTRUCTION Left 02/23/2014   Procedure: LEFT FIFTH BASE EXOSTOSIS/PERONEUS BREVIS REPAIR;  Surgeon: Norleen Armor, MD;  Location: [facility];  Service: Orthopedics;  Laterality: Left;   ANTERIOR CERVICAL DECOMP/DISCECTOMY FUSION N/A 04/16/2017   Procedure: ANTERIOR CERVICAL  DECOMPRESSION/FUSION CERVICAL FIVE-SIX, CERVICAL SIX-SEVEN;  Surgeon: Louis Shove, MD;  Location: [facility];  Service: Neurosurgery;  Laterality: N/A;  right side approach   APPENDECTOMY     CHOLECYSTECTOMY  2005   ESOPHAGOGASTRODUODENOSCOPY ENDOSCOPY  03/2017   IR ESOPHAGUS DILITATION RETRO FLUORO     TOTAL KNEE ARTHROPLASTY Right 06/15/2018   Procedure: RIGHT TOTAL KNEE ARTHROPLASTY;  Surgeon: Josefina Chew, MD;  Location: [facility];  Service: Orthopedics;  Laterality: Right;    MEDICATIONS:  acetaminophen  (TYLENOL ) 500 MG tablet   aspirin  EC 325 MG tablet   baclofen  (LIORESAL ) 10 MG tablet   BIMZELX 160 MG/ML pen   cephALEXin  (KEFLEX ) 250 MG capsule   cephALEXin  (KEFLEX ) 500 MG capsule   fluconazole (DIFLUCAN) 150 MG tablet   HYDROcodone -acetaminophen  (NORCO/VICODIN) 5-325 MG tablet   levothyroxine  (SYNTHROID ) 75 MCG tablet   Multiple Vitamins-Minerals (PRESERVISION AREDS 2) CAPS   ondansetron  (ZOFRAN ) 4 MG tablet   pantoprazole  (PROTONIX ) 40 MG tablet   sennosides-docusate sodium  (SENOKOT-S) 8.6-50 MG tablet  silver sulfADIAZINE (SILVADENE) 1 % cream   simvastatin  (ZOCOR ) 40 MG tablet   Soft Lens Products (OPTI-FREE REWETTING DROPS) SOLN   traMADol  (ULTRAM ) 50 MG tablet   triamcinolone ointment (KENALOG) 0.1 %   Vitamin D, Ergocalciferol, (DRISDOL) 1.25 MG (50000 UNIT) CAPS capsule   XARELTO 20 MG TABS tablet   No current facility-administered medications for this encounter.   She is not currently taking Zofran , Senokot, baclofen , aspirin  325 mg. She is on chronic Keflex  at 250 mg daily, for UTI prevention by notes.   Isaiah Ruder, PA-C Surgical Short Stay/Anesthesiology [facility] Phone [phone] [facility] Phone [phone] 07/26/2024 6:30 PM

## 2024-07-27 ENCOUNTER — Other Ambulatory Visit (HOSPITAL_COMMUNITY)

## 2024-08-01 DIAGNOSIS — M1611 Unilateral primary osteoarthritis, right hip: Principal | ICD-10-CM | POA: Insufficient documentation

## 2024-08-01 NOTE — H&P (Signed)
 TOTAL HIP ADMISSION H&P  Patient is admitted for right total hip arthroplasty.  Subjective:  Chief Complaint: right hip pain  HPI: ADRINNE SZE, 73 y.o. female, has a history of pain and functional disability in the right hip(s) due to arthritis and patient has failed non-surgical conservative treatments for greater than 12 weeks to include flexibility and strengthening excercises, use of assistive devices, weight reduction as appropriate, and activity modification.  Onset of symptoms was gradual starting a few years ago with gradually worsening course since that time.The patient noted no past surgery on the right hip(s).  Patient currently rates pain in the right hip at 10 out of 10 with activity. Patient has night pain, worsening of pain with activity and weight bearing, trendelenberg gait, pain that interfers with activities of daily living, and pain with passive range of motion. Patient has evidence of subchondral cysts, subchondral sclerosis, periarticular osteophytes, and joint space narrowing by imaging studies. This condition presents safety issues increasing the risk of falls. There is no current active infection.  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   Frequent UTI    takes keflex  daily   Full dentures    GERD (gastroesophageal reflux disease)    High cholesterol    History of hiatal hernia    Hypothyroidism    Primary localized osteoarthritis of right knee  06/15/2018    Past Surgical History:  Procedure Laterality Date   ABDOMINAL HYSTERECTOMY     partial   ANKLE RECONSTRUCTION Left 02/23/2014   Procedure: LEFT FIFTH BASE EXOSTOSIS/PERONEUS BREVIS REPAIR;  Surgeon: Norleen Armor, MD;  Location: [facility];  Service: Orthopedics;  Laterality: Left;   ANTERIOR CERVICAL DECOMP/DISCECTOMY FUSION N/A 04/16/2017   Procedure: ANTERIOR CERVICAL DECOMPRESSION/FUSION CERVICAL FIVE-SIX, CERVICAL SIX-SEVEN;  Surgeon: Louis Shove, MD;  Location: [facility];  Service: Neurosurgery;  Laterality: N/A;  right side approach   APPENDECTOMY     CHOLECYSTECTOMY  2005   ESOPHAGOGASTRODUODENOSCOPY ENDOSCOPY  03/2017   IR ESOPHAGUS DILITATION RETRO FLUORO     TOTAL KNEE ARTHROPLASTY Right 06/15/2018   Procedure: RIGHT TOTAL KNEE ARTHROPLASTY;  Surgeon: Josefina Chew, MD;  Location: [facility];  Service: Orthopedics;  Laterality: Right;    No current facility-administered medications for this encounter.   Current Outpatient Medications  Medication Sig Dispense Refill Last Dose/Taking   acetaminophen  (TYLENOL ) 500 MG tablet Take 1,000 mg by mouth 2 (two) times daily as needed for moderate pain.   Taking As Needed   BIMZELX 160 MG/ML pen Inject 320 mg into the skin every 8 (eight) weeks.   Taking   cephALEXin  (KEFLEX ) 250 MG capsule Take 250 mg by mouth daily.   Taking   fluconazole  (DIFLUCAN ) 150 MG tablet Take 150 mg by mouth daily as needed (for yeast due to Bimzelx).   Taking As Needed   HYDROcodone -acetaminophen  (NORCO/VICODIN) 5-325 MG tablet Take 0.5 tablets by mouth every 8 (eight) hours as needed for moderate pain (pain score 4-6).  Taking As Needed   levothyroxine  (SYNTHROID ) 75 MCG tablet Take 75 mcg by mouth daily before breakfast.    Taking   Multiple Vitamins-Minerals (PRESERVISION AREDS 2) CAPS Take 1 capsule by mouth in the morning and at bedtime.   Taking   pantoprazole  (PROTONIX ) 40 MG tablet Take 40 mg by mouth every evening.    Taking   silver  sulfADIAZINE (SILVADENE) 1 % cream Apply 1 Application topically daily.   Taking   simvastatin  (ZOCOR ) 40 MG tablet Take 40 mg by mouth at bedtime.    Taking   Soft Lens Products (OPTI-FREE REWETTING DROPS) SOLN Place 1 drop into both eyes daily as needed (dry eyes).   Taking As Needed   triamcinolone ointment (KENALOG) 0.1 % Apply 1 Application topically daily as needed (itching).   Taking As Needed   Vitamin D, Ergocalciferol, (DRISDOL) 1.25 MG (50000 UNIT) CAPS capsule Take 50,000 Units by mouth every Saturday.   Taking   XARELTO  20 MG TABS tablet Take 20 mg by mouth daily.   Taking   aspirin  EC 325 MG tablet Take 1 tablet (325 mg total) by mouth daily. (Patient not taking: Reported on 07/22/2024) 30 tablet 0 Not Taking   baclofen  (LIORESAL ) 10 MG tablet Take 1 tablet (10 mg total) by mouth 3 (three) times daily. As needed for muscle spasm (Patient not taking: Reported on 07/22/2024) 50 tablet 0 Not Taking   cephALEXin  (KEFLEX ) 500 MG capsule Take 1 capsule (500 mg total) by mouth 4 (four) times daily. (Patient not taking: Reported on 07/22/2024) 28 capsule 0 Not Taking   ondansetron  (ZOFRAN ) 4 MG tablet Take 1 tablet (4 mg total) by mouth every 8 (eight) hours as needed for nausea or vomiting. (Patient not taking: Reported on 07/22/2024) 10 tablet 0 Not Taking   sennosides-docusate sodium  (SENOKOT-S) 8.6-50 MG tablet Take 2 tablets by mouth daily. (Patient not taking: Reported on 07/22/2024) 30 tablet 1 Not Taking   traMADol  (ULTRAM ) 50 MG tablet Take 1-2 tablets (50-100 mg total) by mouth every 12 (twelve) hours as needed. (Patient not taking: Reported on 07/22/2024) 30 tablet 0 Not Taking   Allergies  Allergen Reactions   Methylprednisolone Other (See Comments)    Causes hot flashes    Darvon [Propoxyphene] Nausea And Vomiting   Liraglutide Nausea Only   Lorcaserin Other (See Comments)    HEADACHE   Naltrexone-Bupropion Hcl Er Other (See Comments)    UNABLE TO SWALLOW TABLETS    Social  History   Tobacco Use   Smoking status: Former    Current packs/day: 0.00    Types: Cigarettes    Quit date: 12/07/2006    Years since quitting: 17.6   Smokeless tobacco: Never  Substance Use Topics   Alcohol use: No    No family history on file.   Review of Systems  Objective:  Physical Exam Vitals reviewed.  Constitutional:      Appearance: Normal appearance. She is obese.  HENT:     Head: Normocephalic and atraumatic.  Eyes:     Extraocular Movements: Extraocular movements intact.     Pupils: Pupils are equal, round, and reactive to light.  Cardiovascular:     Rate and Rhythm: Normal rate and regular rhythm.  Pulmonary:     Effort: Pulmonary effort is normal.     Breath sounds: Normal breath sounds.  Abdominal:     Palpations: Abdomen is soft.  Musculoskeletal:     Cervical back: Normal range of motion and neck supple.  Right hip: Tenderness and bony tenderness present. Decreased range of motion. Decreased strength.  Neurological:     Mental Status: She is alert and oriented to person, place, and time.  Psychiatric:        Behavior: Behavior normal.     Vital signs in last 24 hours:    Labs:   Estimated body mass index is 37.55 kg/m as calculated from the following:   Height as of 07/25/24: 5' 3 (1.6 m).   Weight as of 07/25/24: 96.2 kg.   Imaging Review Plain radiographs demonstrate severe degenerative joint disease of the right hip(s). The bone quality appears to be good for age and reported activity level.      Assessment/Plan:  End stage arthritis, right hip(s)  The patient history, physical examination, clinical judgement of the provider and imaging studies are consistent with end stage degenerative joint disease of the right hip(s) and total hip arthroplasty is deemed medically necessary. The treatment options including medical management, injection therapy, arthroscopy and arthroplasty were discussed at length. The risks and benefits of  total hip arthroplasty were presented and reviewed. The risks due to aseptic loosening, infection, stiffness, dislocation/subluxation,  thromboembolic complications and other imponderables were discussed.  The patient acknowledged the explanation, agreed to proceed with the plan and consent was signed. Patient is being admitted for inpatient treatment for surgery, pain control, PT, OT, prophylactic antibiotics, VTE prophylaxis, progressive ambulation and ADL's and discharge planning.The patient is planning to be discharged home with home health services

## 2024-08-02 ENCOUNTER — Encounter (HOSPITAL_COMMUNITY)
Admission: RE | Disposition: A | Discharge status: Home / Self Care | Payer: Self-pay | Source: Home / Self Care | Attending: Orthopaedic Surgery

## 2024-08-02 ENCOUNTER — Other Ambulatory Visit: Payer: Self-pay

## 2024-08-02 ENCOUNTER — Ambulatory Visit (HOSPITAL_COMMUNITY): Payer: Self-pay | Admitting: Vascular Surgery

## 2024-08-02 ENCOUNTER — Observation Stay (HOSPITAL_COMMUNITY)

## 2024-08-02 ENCOUNTER — Ambulatory Visit (HOSPITAL_COMMUNITY)

## 2024-08-02 ENCOUNTER — Encounter (HOSPITAL_COMMUNITY): Payer: Self-pay | Admitting: Orthopaedic Surgery

## 2024-08-02 ENCOUNTER — Observation Stay (HOSPITAL_COMMUNITY)
Admission: RE | Admit: 2024-08-02 | Discharge: 2024-08-03 | Disposition: A | Discharge status: Home / Self Care | Attending: Orthopaedic Surgery | Admitting: Orthopaedic Surgery

## 2024-08-02 ENCOUNTER — Ambulatory Visit (HOSPITAL_BASED_OUTPATIENT_CLINIC_OR_DEPARTMENT_OTHER): Admitting: Anesthesiology

## 2024-08-02 DIAGNOSIS — E039 Hypothyroidism, unspecified: Secondary | ICD-10-CM | POA: Diagnosis not present

## 2024-08-02 DIAGNOSIS — M1611 Unilateral primary osteoarthritis, right hip: Secondary | ICD-10-CM

## 2024-08-02 DIAGNOSIS — Z96651 Presence of right artificial knee joint: Secondary | ICD-10-CM | POA: Insufficient documentation

## 2024-08-02 DIAGNOSIS — Z87891 Personal history of nicotine dependence: Secondary | ICD-10-CM | POA: Diagnosis not present

## 2024-08-02 DIAGNOSIS — M25551 Pain in right hip: Secondary | ICD-10-CM | POA: Diagnosis present

## 2024-08-02 DIAGNOSIS — Z96641 Presence of right artificial hip joint: Secondary | ICD-10-CM

## 2024-08-02 HISTORY — PX: TOTAL HIP ARTHROPLASTY: SHX124

## 2024-08-02 LAB — ABO/RH: ABO/RH(D): A NEG

## 2024-08-02 SURGERY — ARTHROPLASTY, HIP, TOTAL, ANTERIOR APPROACH
Anesthesia: Spinal | Site: Hip | Laterality: Right

## 2024-08-02 MED ORDER — RIVAROXABAN 20 MG PO TABS
20.0000 mg | ORAL_TABLET | Freq: Every day | ORAL | Status: DC
  Administered 2024-08-03: 20 mg via ORAL
  Filled 2024-08-02: qty 1

## 2024-08-02 MED ORDER — DOCUSATE SODIUM 100 MG PO CAPS
100.0000 mg | ORAL_CAPSULE | Freq: Two times a day (BID) | ORAL | Status: DC
  Administered 2024-08-02 – 2024-08-03 (×3): 100 mg via ORAL
  Filled 2024-08-02 (×3): qty 1

## 2024-08-02 MED ORDER — DEXAMETHASONE SODIUM PHOSPHATE 10 MG/ML IJ SOLN
INTRAMUSCULAR | Status: DC | PRN
  Administered 2024-08-02: 8 mg via INTRAVENOUS

## 2024-08-02 MED ORDER — PROPOFOL 10 MG/ML IV BOLUS
INTRAVENOUS | Status: AC
  Filled 2024-08-02: qty 20

## 2024-08-02 MED ORDER — HYDROMORPHONE HCL 1 MG/ML IJ SOLN
0.5000 mg | INTRAMUSCULAR | Status: DC | PRN
  Administered 2024-08-02 (×2): 1 mg via INTRAVENOUS
  Filled 2024-08-02 (×2): qty 1

## 2024-08-02 MED ORDER — CEPHALEXIN 250 MG PO CAPS
250.0000 mg | ORAL_CAPSULE | Freq: Every day | ORAL | Status: DC
  Administered 2024-08-03: 250 mg via ORAL
  Filled 2024-08-02: qty 1

## 2024-08-02 MED ORDER — DIPHENHYDRAMINE HCL 12.5 MG/5ML PO ELIX: 12.5000 mg | ORAL_SOLUTION | ORAL | Status: DC | PRN

## 2024-08-02 MED ORDER — OXYCODONE HCL 5 MG/5ML PO SOLN: 5.0000 mg | Freq: Once | ORAL | Status: AC | PRN

## 2024-08-02 MED ORDER — PANTOPRAZOLE SODIUM 40 MG PO TBEC
40.0000 mg | DELAYED_RELEASE_TABLET | Freq: Every evening | ORAL | Status: DC
  Administered 2024-08-02: 40 mg via ORAL
  Filled 2024-08-02: qty 1

## 2024-08-02 MED ORDER — ALUM & MAG HYDROXIDE-SIMETH 200-200-20 MG/5ML PO SUSP: 30.0000 mL | ORAL | Status: DC | PRN

## 2024-08-02 MED ORDER — SODIUM CHLORIDE 0.9 % IR SOLN
Status: DC | PRN
  Administered 2024-08-02: 1000 mL via INTRAVESICAL

## 2024-08-02 MED ORDER — OXYCODONE HCL 5 MG PO TABS
5.0000 mg | ORAL_TABLET | Freq: Once | ORAL | Status: AC | PRN
  Administered 2024-08-02: 5 mg via ORAL

## 2024-08-02 MED ORDER — ONDANSETRON HCL 4 MG PO TABS: 4.0000 mg | ORAL_TABLET | Freq: Four times a day (QID) | ORAL | Status: DC | PRN

## 2024-08-02 MED ORDER — FENTANYL CITRATE (PF) 100 MCG/2ML IJ SOLN
25.0000 ug | INTRAMUSCULAR | Status: DC | PRN
  Administered 2024-08-02 (×3): 25 ug via INTRAVENOUS
  Administered 2024-08-02: 50 ug via INTRAVENOUS
  Administered 2024-08-02: 25 ug via INTRAVENOUS

## 2024-08-02 MED ORDER — PROPOFOL 10 MG/ML IV BOLUS
INTRAVENOUS | Status: DC | PRN
  Administered 2024-08-02: 130 mg via INTRAVENOUS
  Administered 2024-08-02: 20 mg via INTRAVENOUS
  Administered 2024-08-02 (×2): 10 mg via INTRAVENOUS
  Administered 2024-08-02: 20 mg via INTRAVENOUS
  Administered 2024-08-02: 10 mg via INTRAVENOUS

## 2024-08-02 MED ORDER — ONDANSETRON HCL 4 MG/2ML IJ SOLN: 4.0000 mg | Freq: Once | INTRAMUSCULAR | Status: DC | PRN

## 2024-08-02 MED ORDER — OXYCODONE HCL 5 MG PO TABS
5.0000 mg | ORAL_TABLET | ORAL | Status: DC | PRN
  Filled 2024-08-02 (×2): qty 2

## 2024-08-02 MED ORDER — METOCLOPRAMIDE HCL 5 MG/ML IJ SOLN
5.0000 mg | Freq: Three times a day (TID) | INTRAMUSCULAR | Status: DC | PRN
  Administered 2024-08-02: 10 mg via INTRAVENOUS
  Filled 2024-08-02: qty 2

## 2024-08-02 MED ORDER — ONDANSETRON HCL 4 MG/2ML IJ SOLN
4.0000 mg | Freq: Four times a day (QID) | INTRAMUSCULAR | Status: DC | PRN
  Administered 2024-08-02: 4 mg via INTRAVENOUS
  Filled 2024-08-02: qty 2

## 2024-08-02 MED ORDER — EPHEDRINE SULFATE-NACL 50-0.9 MG/10ML-% IV SOSY
PREFILLED_SYRINGE | INTRAVENOUS | Status: DC | PRN
  Administered 2024-08-02 (×3): 5 mg via INTRAVENOUS

## 2024-08-02 MED ORDER — POVIDONE-IODINE 10 % EX SWAB
2.0000 | Freq: Once | CUTANEOUS | Status: AC
  Administered 2024-08-02: 2 via TOPICAL

## 2024-08-02 MED ORDER — LACTATED RINGERS IV SOLN: INTRAVENOUS | Status: DC

## 2024-08-02 MED ORDER — CHLORHEXIDINE GLUCONATE 0.12 % MT SOLN
15.0000 mL | Freq: Once | OROMUCOSAL | Status: AC
  Administered 2024-08-02: 15 mL via OROMUCOSAL
  Filled 2024-08-02: qty 15

## 2024-08-02 MED ORDER — CEFAZOLIN SODIUM-DEXTROSE 2-4 GM/100ML-% IV SOLN
2.0000 g | INTRAVENOUS | Status: AC
  Administered 2024-08-02: 2 g via INTRAVENOUS
  Filled 2024-08-02: qty 100

## 2024-08-02 MED ORDER — ORAL CARE MOUTH RINSE: 15.0000 mL | Freq: Once | OROMUCOSAL | Status: AC

## 2024-08-02 MED ORDER — 0.9 % SODIUM CHLORIDE (POUR BTL) OPTIME
TOPICAL | Status: DC | PRN
  Administered 2024-08-02: 1000 mL

## 2024-08-02 MED ORDER — ONDANSETRON HCL 4 MG/2ML IJ SOLN
INTRAMUSCULAR | Status: DC | PRN
  Administered 2024-08-02: 4 mg via INTRAVENOUS

## 2024-08-02 MED ORDER — ACETAMINOPHEN 325 MG PO TABS: 325.0000 mg | ORAL_TABLET | Freq: Four times a day (QID) | ORAL | Status: DC | PRN

## 2024-08-02 MED ORDER — ACETAMINOPHEN 10 MG/ML IV SOLN
INTRAVENOUS | Status: AC
  Filled 2024-08-02: qty 100

## 2024-08-02 MED ORDER — FENTANYL CITRATE (PF) 100 MCG/2ML IJ SOLN
INTRAMUSCULAR | Status: AC
  Filled 2024-08-02: qty 2

## 2024-08-02 MED ORDER — SIMVASTATIN 20 MG PO TABS
40.0000 mg | ORAL_TABLET | Freq: Every day | ORAL | Status: DC
  Administered 2024-08-02: 40 mg via ORAL
  Filled 2024-08-02: qty 2

## 2024-08-02 MED ORDER — PHENOL 1.4 % MT LIQD
1.0000 | OROMUCOSAL | Status: DC | PRN
Start: 2024-08-02 — End: 2024-08-03

## 2024-08-02 MED ORDER — METHOCARBAMOL 500 MG PO TABS
500.0000 mg | ORAL_TABLET | Freq: Four times a day (QID) | ORAL | Status: DC | PRN
  Administered 2024-08-03: 500 mg via ORAL
  Filled 2024-08-02: qty 1

## 2024-08-02 MED ORDER — ACETAMINOPHEN 10 MG/ML IV SOLN
1000.0000 mg | Freq: Once | INTRAVENOUS | Status: DC | PRN
  Administered 2024-08-02: 1000 mg via INTRAVENOUS

## 2024-08-02 MED ORDER — FLUCONAZOLE 150 MG PO TABS: 150.0000 mg | ORAL_TABLET | Freq: Every day | ORAL | Status: DC | PRN

## 2024-08-02 MED ORDER — OXYCODONE HCL 5 MG PO TABS
10.0000 mg | ORAL_TABLET | ORAL | Status: DC | PRN
  Administered 2024-08-02: 10 mg via ORAL
  Administered 2024-08-03 (×2): 15 mg via ORAL
  Administered 2024-08-03: 10 mg via ORAL
  Administered 2024-08-03: 15 mg via ORAL
  Filled 2024-08-02 (×3): qty 3
  Filled 2024-08-02: qty 2

## 2024-08-02 MED ORDER — BUPIVACAINE IN DEXTROSE 0.75-8.25 % IT SOLN
INTRATHECAL | Status: DC | PRN
  Administered 2024-08-02: 1.8 mL via INTRATHECAL

## 2024-08-02 MED ORDER — MENTHOL 3 MG MT LOZG: 1.0000 | LOZENGE | OROMUCOSAL | Status: DC | PRN

## 2024-08-02 MED ORDER — LEVOTHYROXINE SODIUM 75 MCG PO TABS
75.0000 ug | ORAL_TABLET | Freq: Every day | ORAL | Status: DC
  Administered 2024-08-03: 75 ug via ORAL
  Filled 2024-08-02: qty 1

## 2024-08-02 MED ORDER — PROPOFOL 500 MG/50ML IV EMUL
INTRAVENOUS | Status: DC | PRN
  Administered 2024-08-02: 75 ug/kg/min via INTRAVENOUS

## 2024-08-02 MED ORDER — LIDOCAINE 2% (20 MG/ML) 5 ML SYRINGE
INTRAMUSCULAR | Status: DC | PRN
  Administered 2024-08-02: 20 mg via INTRAVENOUS

## 2024-08-02 MED ORDER — TRANEXAMIC ACID-NACL 1000-0.7 MG/100ML-% IV SOLN
1000.0000 mg | INTRAVENOUS | Status: AC
  Administered 2024-08-02: 1000 mg via INTRAVENOUS
  Filled 2024-08-02: qty 100

## 2024-08-02 MED ORDER — OXYCODONE HCL 5 MG PO TABS
ORAL_TABLET | ORAL | Status: AC
  Filled 2024-08-02: qty 1

## 2024-08-02 MED ORDER — METOCLOPRAMIDE HCL 5 MG PO TABS: 5.0000 mg | ORAL_TABLET | Freq: Three times a day (TID) | ORAL | Status: DC | PRN

## 2024-08-02 MED ORDER — CEFAZOLIN SODIUM-DEXTROSE 2-4 GM/100ML-% IV SOLN
2.0000 g | Freq: Four times a day (QID) | INTRAVENOUS | Status: AC
  Administered 2024-08-02 (×2): 2 g via INTRAVENOUS
  Filled 2024-08-02 (×2): qty 100

## 2024-08-02 MED ORDER — SODIUM CHLORIDE 0.9 % IV SOLN: INTRAVENOUS | Status: DC

## 2024-08-02 MED ORDER — METHOCARBAMOL 1000 MG/10ML IJ SOLN: 500.0000 mg | Freq: Four times a day (QID) | INTRAMUSCULAR | Status: DC | PRN

## 2024-08-02 SURGICAL SUPPLY — 44 items
BAG COUNTER SPONGE SURGICOUNT (BAG) ×1 IMPLANT
BENZOIN TINCTURE PRP APPL 2/3 (GAUZE/BANDAGES/DRESSINGS) ×1 IMPLANT
BLADE CLIPPER SURG (BLADE) IMPLANT
BLADE SAW SGTL 18X1.27X75 (BLADE) ×1 IMPLANT
COVER SURGICAL LIGHT HANDLE (MISCELLANEOUS) ×1 IMPLANT
CUP ACETAB W/GRIPTION 54 (Plate) IMPLANT
DRAPE C-ARM 42X72 X-RAY (DRAPES) ×1 IMPLANT
DRAPE STERI IOBAN 125X83 (DRAPES) ×1 IMPLANT
DRAPE U-SHAPE 47X51 STRL (DRAPES) ×3 IMPLANT
DRSG AQUACEL AG ADV 3.5X10 (GAUZE/BANDAGES/DRESSINGS) ×1 IMPLANT
DURAPREP 26ML APPLICATOR (WOUND CARE) ×1 IMPLANT
ELECT BLADE 6.5 EXT (BLADE) IMPLANT
ELECTRODE BLDE 4.0 EZ CLN MEGD (MISCELLANEOUS) ×1 IMPLANT
ELECTRODE REM PT RTRN 9FT ADLT (ELECTROSURGICAL) ×1 IMPLANT
FACESHIELD WRAPAROUND OR TEAM (MASK) ×2 IMPLANT
GAUZE XEROFORM 1X8 LF (GAUZE/BANDAGES/DRESSINGS) IMPLANT
GLOVE BIOGEL PI IND STRL 8 (GLOVE) ×2 IMPLANT
GLOVE ECLIPSE 8.0 STRL XLNG CF (GLOVE) ×1 IMPLANT
GLOVE ORTHO TXT STRL SZ7.5 (GLOVE) ×2 IMPLANT
GOWN STRL REUS W/ TWL LRG LVL3 (GOWN DISPOSABLE) ×2 IMPLANT
GOWN STRL REUS W/ TWL XL LVL3 (GOWN DISPOSABLE) ×2 IMPLANT
HEAD CERAMIC DELTA 36 PLUS 1.5 (Hips) IMPLANT
KIT BASIN OR (CUSTOM PROCEDURE TRAY) ×1 IMPLANT
KIT TURNOVER KIT B (KITS) ×1 IMPLANT
LINER NEUTRAL 54X36MM PLUS 4 (Hips) IMPLANT
MANIFOLD NEPTUNE II (INSTRUMENTS) ×1 IMPLANT
NS IRRIG 1000ML POUR BTL (IV SOLUTION) ×1 IMPLANT
PACK TOTAL JOINT (CUSTOM PROCEDURE TRAY) ×1 IMPLANT
PAD ARMBOARD POSITIONER FOAM (MISCELLANEOUS) ×1 IMPLANT
PENCIL BUTTON HOLSTER BLD 10FT (ELECTRODE) IMPLANT
SET HNDPC FAN SPRY TIP SCT (DISPOSABLE) ×1 IMPLANT
STAPLER SKIN PROX 35W (STAPLE) IMPLANT
STEM FEMORAL SZ6 HIGH ACTIS (Stem) IMPLANT
STRIP CLOSURE SKIN 1/2X4 (GAUZE/BANDAGES/DRESSINGS) ×2 IMPLANT
SUT ETHIBOND NAB CT1 #1 30IN (SUTURE) ×1 IMPLANT
SUT MNCRL AB 4-0 PS2 18 (SUTURE) IMPLANT
SUT VIC AB 0 CT1 27XBRD ANBCTR (SUTURE) ×1 IMPLANT
SUT VIC AB 1 CT1 27XBRD ANBCTR (SUTURE) ×1 IMPLANT
SUT VIC AB 2-0 CT1 TAPERPNT 27 (SUTURE) ×1 IMPLANT
TOWEL GREEN STERILE (TOWEL DISPOSABLE) ×1 IMPLANT
TOWEL GREEN STERILE FF (TOWEL DISPOSABLE) ×1 IMPLANT
TRAY CATH INTERMITTENT SS 16FR (CATHETERS) IMPLANT
TRAY FOLEY W/BAG SLVR 16FR ST (SET/KITS/TRAYS/PACK) IMPLANT
WATER STERILE IRR 1000ML POUR (IV SOLUTION) ×2 IMPLANT

## 2024-08-02 NOTE — Anesthesia Procedure Notes (Signed)
 Spinal  Patient location during procedure: OR Start time: 08/02/2024 8:45 AM End time: 08/02/2024 8:46 AM Reason for block: surgical anesthesia Staffing Performed: anesthesiologist  Anesthesiologist: Keneth Lynwood POUR, MD Performed by: Keneth Lynwood POUR, MD Authorized by: Keneth Lynwood POUR, MD   Preanesthetic Checklist Completed: patient identified, IV checked, site marked, risks and benefits discussed, surgical consent, monitors and equipment checked, pre-op evaluation and timeout performed Spinal Block Patient position: sitting Prep: DuraPrep Patient monitoring: heart rate, cardiac monitor, continuous pulse ox and blood pressure Approach: midline Location: L3-4 Injection technique: single-shot Needle Needle type: Sprotte  Needle gauge: 24 G Needle length: 9 cm Assessment Sensory level: T4 Events: CSF return

## 2024-08-02 NOTE — Interval H&P Note (Signed)
 History and Physical Interval Note: The patient understands that she is here today for right total hip replacement to treat her significant right hip pain and arthritis.  There has been no acute or interval change in her medical status.  The risks and benefits of surgery have been discussed in detail and informed consent has been obtained.  The right operative hip has been marked.  08/02/2024 7:09 AM  Natalie Sanders  has presented today for surgery, with the diagnosis of osteoarthritis right hip.  The various methods of treatment have been discussed with the patient and family. After consideration of risks, benefits and other options for treatment, the patient has consented to  Procedure(s): ARTHROPLASTY, HIP, TOTAL, ANTERIOR APPROACH (Right) as a surgical intervention.  The patient's history has been reviewed, patient examined, no change in status, stable for surgery.  I have reviewed the patient's chart and labs.  Questions were answered to the patient's satisfaction.     [name]

## 2024-08-02 NOTE — Anesthesia Procedure Notes (Signed)
 Procedure Name: LMA Insertion Date/Time: 08/02/2024 9:14 AM  Performed by: Vertie Arthea RAMAN, CRNAPre-anesthesia Checklist: Patient identified, Emergency Drugs available, Suction available and Patient being monitored Patient Re-evaluated:Patient Re-evaluated prior to induction Oxygen Delivery Method: Circle System Utilized Preoxygenation: Pre-oxygenation with 100% oxygen Induction Type: IV induction Ventilation: Mask ventilation without difficulty LMA: LMA inserted LMA Size: 4.0 Number of attempts: 1 Airway Equipment and Method: Bite block Placement Confirmation: positive ETCO2 Tube secured with: Tape Dental Injury: Teeth and Oropharynx as per pre-operative assessment

## 2024-08-02 NOTE — Evaluation (Signed)
 Physical Therapy Evaluation Patient Details Name: Natalie Sanders MRN: [id number] DOB: 1950/12/26 Today's Date: 08/02/2024  History of Present Illness  73 y.o. female presents to [facility] hospital on 08/02/2024 for elective R THA. PMH includes R TKA, herniated cervical disc.  Clinical Impression  Pt presents to PT with deficits in functional mobility, gait, balance, strength, ROM. Pt is able to ambulate for household distances with support of the RW. PT provides education on the THA exercise packet and encourages frequent mobilization with staff assistance. PT will follow up tomorrow for a progression of gait and to initiate stair training.        If plan is discharge home, recommend the following: A little help with bathing/dressing/bathroom;Assistance with cooking/housework;Assist for transportation;Help with stairs or ramp for entrance   Can travel by private vehicle        Equipment Recommendations BSC/3in1  Recommendations for Other Services       Functional Status Assessment Patient has had a recent decline in their functional status and demonstrates the ability to make significant improvements in function in a reasonable and predictable amount of time.     Precautions / Restrictions Precautions Precautions: Fall Recall of Precautions/Restrictions: Intact Precaution/Restrictions Comments: direct anterior THA, no precautions Restrictions Weight Bearing Restrictions Per Provider Order: Yes RLE Weight Bearing Per Provider Order: Weight bearing as tolerated      Mobility  Bed Mobility Overal bed mobility: Needs Assistance Bed Mobility: Supine to Sit, Sit to Supine     Supine to sit: Min assist, HOB elevated, Used rails Sit to supine: Min assist, HOB elevated        Transfers Overall transfer level: Needs assistance Equipment used: Rolling walker (2 wheels) Transfers: Sit to/from Stand Sit to Stand: Contact guard assist                 Ambulation/Gait Ambulation/Gait assistance: Contact guard assist Gait Distance (Feet): 30 Feet Assistive device: Rolling walker (2 wheels) Gait Pattern/deviations: Step-through pattern Gait velocity: reduced Gait velocity interpretation: <1.31 ft/sec, indicative of household ambulator   General Gait Details: slowed step-through gait, reduced stance time on RLE  Stairs            Wheelchair Mobility     Tilt Bed    Modified Rankin (Stroke Patients Only)       Balance Overall balance assessment: Needs assistance Sitting-balance support: No upper extremity supported, Feet supported Sitting balance-Leahy Scale: Good     Standing balance support: Bilateral upper extremity supported, Reliant on assistive device for balance Standing balance-Leahy Scale: Poor                               Pertinent Vitals/Pain Pain Assessment Pain Assessment: 0-10 Pain Score: 6  Pain Location: R hip Pain Descriptors / Indicators: Sore Pain Intervention(s): Monitored during session    Home Living Family/patient expects to be discharged to:: Private residence Living Arrangements: Spouse/significant other Available Help at Discharge: Family;Available 24 hours/day Type of Home: House Home Access: Stairs to enter Entrance Stairs-Rails: Left Entrance Stairs-Number of Steps: 2   Home Layout: One level Home Equipment: Agricultural consultant (2 wheels);Cane - single point;Crutches;Rollator (4 wheels)      Prior Function Prior Level of Function : Independent/Modified Independent;Driving             Mobility Comments: ambulatory with rollator recentlyin the home, SPC and PRN assistance of spouse in the community       Extremity/Trunk  Assessment   Upper Extremity Assessment Upper Extremity Assessment: Overall WFL for tasks assessed    Lower Extremity Assessment Lower Extremity Assessment: RLE deficits/detail RLE Deficits / Details: generalized post-op weakness as  anticipated on POD 0    Cervical / Trunk Assessment Cervical / Trunk Assessment: Other exceptions Cervical / Trunk Exceptions: body habitus  Communication   Communication Communication: No apparent difficulties    Cognition Arousal: Alert Behavior During Therapy: WFL for tasks assessed/performed   PT - Cognitive impairments: No apparent impairments                         Following commands: Intact       Cueing Cueing Techniques: Verbal cues     General Comments General comments (skin integrity, edema, etc.): pt on 2L [place] upon PT arrival, weaned to room air for activity. Pt is in NAd however SpO2 found to be in high 80s upon completion of mobility. Pt does recover to 95% resting in bed on room air.    Exercises     Assessment/Plan    PT Assessment Patient needs continued PT services  PT Problem List Decreased strength;Decreased mobility;Decreased range of motion;Decreased activity tolerance;Decreased balance;Decreased knowledge of use of DME;Pain       PT Treatment Interventions DME instruction;Gait training;Stair training;Functional mobility training;Therapeutic activities;Therapeutic exercise;Balance training;Neuromuscular re-education;Patient/family education    PT Goals (Current goals can be found in the Care Plan section)  Acute Rehab PT Goals Patient Stated Goal: to return to independence PT Goal Formulation: With patient Time For Goal Achievement: 08/06/24 Potential to Achieve Goals: Good    Frequency 7X/week     Co-evaluation               AM-PAC PT 6 Clicks Mobility  Outcome Measure Help needed turning from your back to your side while in a flat bed without using bedrails?: A Little Help needed moving from lying on your back to sitting on the side of a flat bed without using bedrails?: A Little Help needed moving to and from a bed to a chair (including a wheelchair)?: A Little Help needed standing up from a chair using your arms (e.g.,  wheelchair or bedside chair)?: A Little Help needed to walk in hospital room?: A Little Help needed climbing 3-5 steps with a railing? : A Lot 6 Click Score: 17    End of Session Equipment Utilized During Treatment: Gait belt Activity Tolerance: Patient tolerated treatment well Patient left: in bed;with call bell/phone within reach;with bed alarm set Nurse Communication: Mobility status PT Visit Diagnosis: Other abnormalities of gait and mobility (R26.89);Muscle weakness (generalized) (M62.81);Pain Pain - Right/Left: Right Pain - part of body: Hip    Time: 8480-8454 PT Time Calculation (min) (ACUTE ONLY): 26 min   Charges:   PT Evaluation $PT Eval Low Complexity: 1 Low   PT General Charges $$ ACUTE PT VISIT: 1 Visit         Bernardino JINNY Ruth, PT, DPT Acute Rehabilitation Office [phone]   Bernardino JINNY Ruth 08/02/2024, 4:13 PM

## 2024-08-02 NOTE — Anesthesia Postprocedure Evaluation (Signed)
 Anesthesia Post Note  Patient: Natalie Sanders  Procedure(s) Performed: ARTHROPLASTY, HIP, TOTAL, ANTERIOR APPROACH (Right: Hip)     Patient location during evaluation: PACU Anesthesia Type: General Level of consciousness: awake Pain management: pain level controlled Vital Signs Assessment: post-procedure vital signs reviewed and stable Respiratory status: spontaneous breathing, nonlabored ventilation and respiratory function stable Cardiovascular status: blood pressure returned to baseline and stable Postop Assessment: no apparent nausea or vomiting Anesthetic complications: no   No notable events documented.  Last Vitals:  Vitals:   08/02/24 1256 08/02/24 1317  BP:  (!) 139/48  Pulse: 64 62  Resp:  16  Temp:  (!) 36.2 C  SpO2: 96% 93%    Last Pain:  Vitals:   08/02/24 1430  TempSrc:   PainSc: Asleep                 Zeplin Aleshire P Salimah Martinovich

## 2024-08-02 NOTE — Op Note (Signed)
 Operative Note  Date of operation: 08/02/2024 Preoperative diagnosis: Right hip primary osteoarthritis Postoperative diagnosis: Same  Procedure: Right direct anterior total hip arthroplasty  Implants: Implant Name Type Inv. Item Serial No. Manufacturer Lot No. LRB No. Used Action  LINER NEUTRAL [id number] PLUS 4 - [id number] Hips LINER NEUTRAL 54X36MM PLUS 4  DEPUY [employer]TH[employer] [id number] Right 1 Implanted  CUP ACETAB W/GRIPTION 54 - [id number] Plate CUP ACETAB W/GRIPTION 54  DEPUY ORTH[employer] [id number] Right 1 Implanted  STEM FEMORAL SZ6 HIGH ACTIS - [id number] Stem STEM FEMORAL SZ6 HIGH ACTIS  DEPUY ORTHOPAEDICS [id number] Right 1 Implanted  HEAD CERAMIC DELTA 36 PLUS 1.5 - [id number] Hips HEAD CERAMIC DELTA 36 PLUS 1.5  DEPUY ORTHOPA[employer] [id number] Right 1 Implanted   Surgeon: Lonni GRADE. Vernetta, MD Assistant: Alisa Gaskins, PA-C  Anesthesia: #1 spinal, #2 General EBL: 50 cc Antibiotics: IV Ancef  Complications: None  Indications: The patient is a 73 year old active female with debilitating arthritis involving her right hip.  This has been seen on clinical exam and x-ray findings.  At this point her right hip pain is daily and it is detrimentally affecting her mobility, her quality of life and her actives daily living to the point that she does proceed with a total hip arthroplasty and we agree with this as well.  We did discuss the risks of acute blood loss anemia, nerve surgery, fracture, infection, DVT, dislocation, implant failure, leg length differences and wound healing issues.  She understands that our goals are hopefully decreased pain, improved mobility and improve quality of life.  Procedure description: After informed consent was obtained and appropriate right hip was marked, the patient was brought to the operating room and set up on the stretcher where spinal anesthesia was obtained.  She was then laid in supine position on the stretcher and a Foley catheter was placed.  Traction  boots were placed on both her feet and next she was placed supine on the Hana fracture table with a perineal post and placed in both legs and inline skeletal traction devices no traction applied.  Her right operative hip was assessed radiographically.  The right hip was prepped and draped with DuraPrep and sterile drapes.  Timeout was called and she was notified as correct patient agreed right hip.  She did keep coughing quite a bit and anesthesia felt it was best to perform general anesthesia so they did perform general anesthesia before we made her incision.  We then made our incision just inferior and posterior to the ASIS and carried slightly obliquely down the leg.  Dissection was carried down to the tensor fascia lata muscle and the tensor fascia was divided longitudinally to proceed with a direct anterior process of the hip.  Circumflex vessels were identified and cauterized.  The hip capsule was identified and opened up in L-type format finding a moderate joint effusion.  Cobra retractors were placed around the medial and lateral femoral neck and a femoral neck cut was made with an oscillating saw just proximal to the lesser trochanter and this cut was completed with an osteotome.  A corkscrew guide was placed in the femoral head and the femoral head was removed in its entirety finding a wide area devoid of cartilage.  A bent Hohmann was then placed over the medial acetabular rim and remnants of the acetabular labrum and other debris removed.  Reaming was then initiated from a size 43 reamer and stepwise increments going up to a size 53 reamer with all reamers placed under direct visualization  and the last reamer also placed under direct fluoroscopy in order to obtain the depth of reaming, the inclination and anteversion.  The real DePuy Sectra GRIPTION acetabular component size 54 was placed without difficulty followed by a 36+4 polythene liner.  Attention was then turned to the femur.  With the right leg  externally rotated to 120 degrees, extended and abducted, a Mueller retractor was placed medially and a Hohmann retractor behind the trochanter.  The lateral joint capsule was released and a box cutting osteotome was used into the femoral canal.  Broaching was then initiated using the Actis broaching system from a size 0 going up and stepwise increments to a size 6.  With a size 6 in place we trialed a standard offset femoral neck and a 36+1.5 head ball.  The right leg was brought over and up and with traction and interpretation reduced the pelvis.  Based on radiographic and clinical assessment we needed more offset.  We dislocated the hip and withdraw components.  We then placed the real Actis femoral but with high offset size 6 and with the real 36+1.5 ceramic head ball.  Again this was reduced in the pelvis and replaced with leg length, offset, range of motion and stability assessed mechanically and radiographically.  The soft tissue was then irrigated with normal saline solution.  Remnants of the joint capsule were closed with interrupted #1 Ethibond suture followed by #1 Vicryl to close the tensor fascia.  0 Vicryl was used to close deep tissue and 2-0 Vicryl was used to close subcu tissue.  The skin was closed with staples.  An Aquacel dressing was applied.  The patient was taken off on table, awakened and extubated and taken recovery.  Tory Gaskins, PA-C did assist during the entire case and beginning to end and his assistance was crucial and medically necessary for soft tissue management and retraction, helping guide implant placement and a layered closure of the wound.

## 2024-08-02 NOTE — Discharge Instructions (Signed)
 Per [facility] clinic policy, our goal is ensure optimal postoperative pain control with a multimodal pain management strategy. For all [facility] patients, our goal is to wean post-operative narcotic medications by 6 weeks post-operatively. If this is not possible due to utilization of pain medication prior to surgery, your [facility] doctor will support your acute post-operative pain control for the first 6 weeks postoperatively, with a plan to transition you back to your primary pain team following that. Natalie Sanders will work to ensure a Therapist, occupational.  INSTRUCTIONS AFTER JOINT REPLACEMENT   Remove items at home which could result in a fall. This includes throw rugs or furniture in walking pathways ICE to the affected joint every three hours while awake for 30 minutes at a time, for at least the first 3-5 days, and then as needed for pain and swelling.  Continue to use ice for pain and swelling. You may notice swelling that will progress down to the foot and ankle.  This is normal after surgery.  Elevate your leg when you are not up walking on it.   Continue to use the breathing machine you got in the hospital (incentive spirometer) which will help keep your temperature down.  It is common for your temperature to cycle up and down following surgery, especially at night when you are not up moving around and exerting yourself.  The breathing machine keeps your lungs expanded and your temperature down.   DIET:  As you were doing prior to hospitalization, we recommend a well-balanced diet.  DRESSING / WOUND CARE / SHOWERING  Keep the surgical dressing until follow up.  The dressing is water  proof, so you can shower without any extra covering.  IF THE DRESSING FALLS OFF or the wound gets wet inside, change the dressing with sterile gauze.  Please use good hand washing techniques before changing the dressing.  Do not use any lotions or creams on the incision until instructed by your surgeon.     ACTIVITY  Increase activity slowly as tolerated, but follow the weight bearing instructions below.   No driving for 6 weeks or until further direction given by your physician.  You cannot drive while taking narcotics.  No lifting or carrying greater than 10 lbs. until further directed by your surgeon. Avoid periods of inactivity such as sitting longer than an hour when not asleep. This helps prevent blood clots.  You may return to work once you are authorized by your doctor.     WEIGHT BEARING   Weight bearing as tolerated with assist device (walker, cane, etc) as directed, use it as long as suggested by your surgeon or therapist, typically at least 4-6 weeks.   EXERCISES  Results after joint replacement surgery are often greatly improved when you follow the exercise, range of motion and muscle strengthening exercises prescribed by your doctor. Safety measures are also important to protect the joint from further injury. Any time any of these exercises cause you to have increased pain or swelling, decrease what you are doing until you are comfortable again and then slowly increase them. If you have problems or questions, call your caregiver or physical therapist for advice.   Rehabilitation is important following a joint replacement. After just a few days of immobilization, the muscles of the leg can become weakened and shrink (atrophy).  These exercises are designed to build up the tone and strength of the thigh and leg muscles and to improve motion. Often times heat used for twenty to thirty minutes before  working out will loosen up your tissues and help with improving the range of motion but do not use heat for the first two weeks following surgery (sometimes heat can increase post-operative swelling).   These exercises can be done on a training (exercise) mat, on the floor, on a table or on a bed. Use whatever works the best and is most comfortable for you.    Use music or television  while you are exercising so that the exercises are a pleasant break in your day. This will make your life better with the exercises acting as a break in your routine that you can look forward to.   Perform all exercises about fifteen times, three times per day or as directed.  You should exercise both the operative leg and the other leg as well.  Exercises include:   Quad Sets - Tighten up the muscle on the front of the thigh (Quad) and hold for 5-10 seconds.   Straight Leg Raises - With your knee straight (if you were given a brace, keep it on), lift the leg to 60 degrees, hold for 3 seconds, and slowly lower the leg.  Perform this exercise against resistance later as your leg gets stronger.  Leg Slides: Lying on your back, slowly slide your foot toward your buttocks, bending your knee up off the floor (only go as far as is comfortable). Then slowly slide your foot back down until your leg is flat on the floor again.  Angel Wings: Lying on your back spread your legs to the side as far apart as you can without causing discomfort.  Hamstring Strength:  Lying on your back, push your heel against the floor with your leg straight by tightening up the muscles of your buttocks.  Repeat, but this time bend your knee to a comfortable angle, and push your heel against the floor.  You may put a pillow under the heel to make it more comfortable if necessary.   A rehabilitation program following joint replacement surgery can speed recovery and prevent re-injury in the future due to weakened muscles. Contact your doctor or a physical therapist for more information on knee rehabilitation.    CONSTIPATION  Constipation is defined medically as fewer than three stools per week and severe constipation as less than one stool per week.  Even if you have a regular bowel pattern at home, your normal regimen is likely to be disrupted due to multiple reasons following surgery.  Combination of anesthesia, postoperative  narcotics, change in appetite and fluid intake all can affect your bowels.   YOU MUST use at least one of the following options; they are listed in order of increasing strength to get the job done.  They are all available over the counter, and you may need to use some, POSSIBLY even all of these options:    Drink plenty of fluids (prune juice may be helpful) and high fiber foods Colace 100 mg by mouth twice a day  Senokot for constipation as directed and as needed Dulcolax (bisacodyl ), take with full glass of water   Miralax  (polyethylene glycol) once or twice a day as needed.  If you have tried all these things and are unable to have a bowel movement in the first 3-4 days after surgery call either your surgeon or your primary doctor.    If you experience loose stools or diarrhea, hold the medications until you stool forms back up.  If your symptoms do not get better within 1 week  or if they get worse, check with your doctor.  If you experience the worst abdominal pain ever or develop nausea or vomiting, please contact the office immediately for further recommendations for treatment.   ITCHING:  If you experience itching with your medications, try taking only a single pain pill, or even half a pain pill at a time.  You can also use Benadryl  over the counter for itching or also to help with sleep.   TED HOSE STOCKINGS:  Use stockings on both legs until for at least 2 weeks or as directed by physician office. They may be removed at night for sleeping.  MEDICATIONS:  See your medication summary on the "After Visit Summary" that nursing will review with you.  You may have some home medications which will be placed on hold until you complete the course of blood thinner medication.  It is important for you to complete the blood thinner medication as prescribed.  PRECAUTIONS:  If you experience chest pain or shortness of breath - call 911 immediately for transfer to the hospital emergency department.    If you develop a fever greater that 101 F, purulent drainage from wound, increased redness or drainage from wound, foul odor from the wound/dressing, or calf pain - CONTACT YOUR SURGEON.                                                   FOLLOW-UP APPOINTMENTS:  If you do not already have a post-op appointment, please call the office for an appointment to be seen by your surgeon.  Guidelines for how soon to be seen are listed in your "After Visit Summary", but are typically between 1-4 weeks after surgery.  OTHER INSTRUCTIONS:   Knee Replacement:  Do not place pillow under knee, focus on keeping the knee straight while resting. CPM instructions: 0-90 degrees, 2 hours in the morning, 2 hours in the afternoon, and 2 hours in the evening. Place foam block, curve side up under heel at all times except when in CPM or when walking.  DO NOT modify, tear, cut, or change the foam block in any way.  POST-OPERATIVE OPIOID TAPER INSTRUCTIONS: It is important to wean off of your opioid medication as soon as possible. If you do not need pain medication after your surgery it is ok to stop day one. Opioids include: Codeine, Hydrocodone(Norco, Vicodin), Oxycodone (Percocet, oxycontin ) and hydromorphone  amongst others.  Long term and even short term use of opiods can cause: Increased pain response Dependence Constipation Depression Respiratory depression And more.  Withdrawal symptoms can include Flu like symptoms Nausea, vomiting And more Techniques to manage these symptoms Hydrate well Eat regular healthy meals Stay active Use relaxation techniques(deep breathing, meditating, yoga) Do Not substitute Alcohol to help with tapering If you have been on opioids for less than two weeks and do not have pain than it is ok to stop all together.  Plan to wean off of opioids This plan should start within one week post op of your joint replacement. Maintain the same interval or time between taking each dose  and first decrease the dose.  Cut the total daily intake of opioids by one tablet each day Next start to increase the time between doses. The last dose that should be eliminated is the evening dose.   MAKE SURE YOU:  Understand these instructions.  Get help right away if you are not doing well or get worse.    Thank you for letting us  be a part of your medical care team.  It is a privilege we respect greatly.  We hope these instructions will help you stay on track for a fast and full recovery!     Dental Antibiotics:  In most cases prophylactic antibiotics for Dental procdeures after total joint surgery are not necessary.  Exceptions are as follows:  1. History of prior total joint infection  2. Severely immunocompromised (Organ Transplant, cancer chemotherapy, Rheumatoid biologic meds such as Humera)  3. Poorly controlled diabetes (A1C &gt; 8.0, blood glucose over 200)  If you have one of these conditions, contact your surgeon for an antibiotic prescription, prior to your dental procedure.

## 2024-08-02 NOTE — Transfer of Care (Signed)
 Immediate Anesthesia Transfer of Care Note  Patient: Natalie Sanders  Procedure(s) Performed: ARTHROPLASTY, HIP, TOTAL, ANTERIOR APPROACH (Right: Hip)  Patient Location: PACU  Anesthesia Type:General and Spinal  Level of Consciousness: awake, alert , and oriented  Airway & Oxygen Therapy: Patient Spontanous Breathing and Patient connected to face mask oxygen  Post-op Assessment: Report given to RN and Post -op Vital signs reviewed and stable  Post vital signs: Reviewed and stable  Last Vitals:  Vitals Value Taken Time  BP 110/48 08/02/24 10:30  Temp    Pulse 60 08/02/24 10:36  Resp 11 08/02/24 10:36  SpO2 97 % 08/02/24 10:36  Vitals shown include unfiled device data.  Last Pain:  Vitals:   08/02/24 0656  TempSrc:   PainSc: 5       Patients Stated Pain Goal: 1 (08/02/24 0656)  Complications: No notable events documented.

## 2024-08-03 ENCOUNTER — Encounter (HOSPITAL_COMMUNITY): Payer: Self-pay | Admitting: Orthopaedic Surgery

## 2024-08-03 DIAGNOSIS — M1611 Unilateral primary osteoarthritis, right hip: Secondary | ICD-10-CM | POA: Diagnosis not present

## 2024-08-03 LAB — BASIC METABOLIC PANEL WITH GFR
Anion gap: 10 (ref 5–15)
BUN: 10 mg/dL (ref 8–23)
CO2: 23 mmol/L (ref 22–32)
Calcium: 8.5 mg/dL — ABNORMAL LOW (ref 8.9–10.3)
Chloride: 105 mmol/L (ref 98–111)
Creatinine, Ser: 0.77 mg/dL (ref 0.44–1.00)
GFR, Estimated: >60 mL/min (ref >60)
Glucose, Bld: 136 mg/dL — ABNORMAL HIGH (ref 70–99)
Potassium: 4.1 mmol/L (ref 3.5–5.1)
Sodium: 138 mmol/L (ref 135–145)

## 2024-08-03 LAB — CBC
HCT: 38.7 % (ref 36.0–46.0)
Hemoglobin: 13.1 g/dL (ref 12.0–15.0)
MCH: 30.5 pg (ref 26.0–34.0)
MCHC: 33.9 g/dL (ref 30.0–36.0)
MCV: 90.2 fL (ref 80.0–100.0)
Platelets: 189 K/uL (ref 150–400)
RBC: 4.29 MIL/uL (ref 3.87–5.11)
RDW: 13.7 % (ref 11.5–15.5)
WBC: 13.4 K/uL — ABNORMAL HIGH (ref 4.0–10.5)
nRBC: 0 % (ref 0.0–0.2)

## 2024-08-03 MED ORDER — OXYCODONE HCL 5 MG PO TABS: 5.0000 mg | ORAL_TABLET | ORAL | 0 refills | Status: DC | PRN

## 2024-08-03 MED ORDER — TIZANIDINE HCL 4 MG PO TABS: 4.0000 mg | ORAL_TABLET | Freq: Four times a day (QID) | ORAL | 0 refills | Status: DC | PRN

## 2024-08-03 NOTE — Progress Notes (Signed)
 AVS completed for discharge packet and placed with chart.

## 2024-08-03 NOTE — Care Management Obs Status (Signed)
 MEDICARE OBSERVATION STATUS NOTIFICATION   Patient Details  Name: Natalie Sanders MRN: [id number] Date of Birth: Oct 14, 1951   Medicare Observation Status Notification Given:       Jon Cruel 08/03/2024, 11:52 AM

## 2024-08-03 NOTE — Progress Notes (Signed)
 Subjective: 1 Day Post-Op Procedure(s) (LRB): ARTHROPLASTY, HIP, TOTAL, ANTERIOR APPROACH (Right) Patient reports pain as moderate.    Objective: Vital signs in last 24 hours: Temp:  [97.2 F (36.2 C)-98.3 F (36.8 C)] 98 F (36.7 C) (08/27 0425) Pulse Rate:  [44-70] 61 (08/27 0425) Resp:  [10-19] 18 (08/27 0425) BP: (106-144)/(48-102) 127/54 (08/27 0425) SpO2:  [93 %-100 %] 94 % (08/27 0425)  Intake/Output from previous day: 08/26 0701 - 08/27 0700 In: 1540 [P.O.:240; I.V.:1200; IV Piggyback:100] Out: 1600 [Urine:1550; Blood:50] Intake/Output this shift: No intake/output data recorded.  Recent Labs    08/03/24 0505  HGB 13.1   Recent Labs    08/03/24 0505  WBC 13.4*  RBC 4.29  HCT 38.7  PLT 189   Recent Labs    08/03/24 0505  NA 138  K 4.1  CL 105  CO2 23  BUN 10  CREATININE 0.77  GLUCOSE 136*  CALCIUM 8.5*   No results for input(s): LABPT, INR in the last 72 hours.  Sensation intact distally Intact pulses distally Dorsiflexion/Plantar flexion intact Incision: scant drainage   Assessment/Plan: 1 Day Post-Op Procedure(s) (LRB): ARTHROPLASTY, HIP, TOTAL, ANTERIOR APPROACH (Right) Up with therapy      [name] 08/03/2024, 7:25 AM

## 2024-08-03 NOTE — Progress Notes (Signed)
 Physical Therapy Treatment Patient Details Name: Natalie Sanders MRN: [id number] DOB: 02/24/1951 Today's Date: 08/03/2024   History of Present Illness 73 y.o. female presents to [facility] hospital on 08/02/2024 for elective R THA. PMH includes R TKA, herniated cervical disc.    PT Comments  Pt resting in bed on arrival, pleasant and agreeable to session with good progress towards acute goals. Pt  requiring light min A to manage RLE to and off EOB and to stand from EOB at lowest height. Pt able to stand from elevated BSC over commode with CGA. Discussed pt sleeping in recliner chair for a few days until more confident with sit>stand from lower height surfaces. Pt demonstrating gait for hallway distance with CGA for safety with RW for support. IS issued with pt verbalizing understanding of use and frequency. Plan to progress stair training in PM session. Pt continues to benefit from skilled PT services to progress toward functional mobility goals.     If plan is discharge home, recommend the following: A little help with bathing/dressing/bathroom;Assistance with cooking/housework;Assist for transportation;Help with stairs or ramp for entrance   Can travel by private vehicle        Equipment Recommendations  BSC/3in1    Recommendations for Other Services       Precautions / Restrictions Precautions Precautions: Fall Recall of Precautions/Restrictions: Intact Precaution/Restrictions Comments: direct anterior THA, no precautions Restrictions Weight Bearing Restrictions Per Provider Order: Yes RLE Weight Bearing Per Provider Order: Weight bearing as tolerated     Mobility  Bed Mobility Overal bed mobility: Needs Assistance Bed Mobility: Supine to Sit     Supine to sit: Min assist, HOB elevated, Used rails     General bed mobility comments: light min A to manage RLE to and off EOB    Transfers Overall transfer level: Needs assistance Equipment used: Rolling walker (2  wheels) Transfers: Sit to/from Stand Sit to Stand: Contact guard assist, Min assist           General transfer comment: pt standing from EOB at lowest height with light min A to boost to stand, CGA from elevated BSC over commode    Ambulation/Gait Ambulation/Gait assistance: Contact guard assist Gait Distance (Feet): 150 Feet (+ 15') Assistive device: Rolling walker (2 wheels) Gait Pattern/deviations: Step-through pattern, Step-to pattern Gait velocity: reduced     General Gait Details: slowed step to pattern progressing to step-through gait, reduced stance time on RLE   Stairs Stairs:  (plan to progress in PM session)           Wheelchair Mobility     Tilt Bed    Modified Rankin (Stroke Patients Only)       Balance Overall balance assessment: Needs assistance Sitting-balance support: No upper extremity supported, Feet supported Sitting balance-Leahy Scale: Good     Standing balance support: Bilateral upper extremity supported, Reliant on assistive device for balance Standing balance-Leahy Scale: Poor Standing balance comment: reliant on RW support                            Communication Communication Communication: No apparent difficulties  Cognition Arousal: Alert Behavior During Therapy: WFL for tasks assessed/performed   PT - Cognitive impairments: No apparent impairments                         Following commands: Intact      Cueing Cueing Techniques: Verbal cues  Exercises  General Comments        Pertinent Vitals/Pain Pain Assessment Pain Assessment: Faces Faces Pain Scale: Hurts little more Pain Location: R hip Pain Descriptors / Indicators: Sore, Grimacing, Guarding Pain Intervention(s): Monitored during session, Limited activity within patient's tolerance, Repositioned    Home Living                          Prior Function            PT Goals (current goals can now be found in the care  plan section) Acute Rehab PT Goals Patient Stated Goal: to return to independence PT Goal Formulation: With patient Time For Goal Achievement: 08/06/24 Progress towards PT goals: Progressing toward goals    Frequency    7X/week      PT Plan      Co-evaluation              AM-PAC PT 6 Clicks Mobility   Outcome Measure  Help needed turning from your back to your side while in a flat bed without using bedrails?: A Little Help needed moving from lying on your back to sitting on the side of a flat bed without using bedrails?: A Little Help needed moving to and from a bed to a chair (including a wheelchair)?: A Little Help needed standing up from a chair using your arms (e.g., wheelchair or bedside chair)?: A Little Help needed to walk in hospital room?: A Little Help needed climbing 3-5 steps with a railing? : A Lot 6 Click Score: 17    End of Session   Activity Tolerance: Patient tolerated treatment well Patient left: with call bell/phone within reach;in chair Nurse Communication: Mobility status PT Visit Diagnosis: Other abnormalities of gait and mobility (R26.89);Muscle weakness (generalized) (M62.81);Pain Pain - Right/Left: Right Pain - part of body: Hip     Time: 0825-0856 PT Time Calculation (min) (ACUTE ONLY): 31 min  Charges:    $Gait Training: 23-37 mins PT General Charges $$ ACUTE PT VISIT: 1 Visit                     Antonina Deziel R. PTA Acute Rehabilitation Services Office: [phone]   Therisa CHRISTELLA Boor 08/03/2024, 9:01 AM

## 2024-08-03 NOTE — Progress Notes (Signed)
 Patient ID: Natalie Sanders, female   DOB: 02-11-1951, 73 y.o.   MRN: [id number] According to the patient, she did very well with her morning physical therapy session.  She states that PT is coming back this afternoon for a second session.  She can then make discharged to home later this afternoon.

## 2024-08-03 NOTE — Progress Notes (Signed)
 Physical Therapy Treatment Patient Details Name: Natalie Sanders MRN: [id number] DOB: 23-Dec-1950 Today's Date: 08/03/2024   History of Present Illness 73 y.o. female presents to [facility] hospital on 08/02/2024 for elective R THA. PMH includes R TKA, herniated cervical disc.    PT Comments  Pt up in chair on arrival and agreeable to session with focus on stair training for safety with mobility post acutely. Pt able to ascend/descent x2 steps for 2 trials in therapy gym with unilateral rail support on L to simulate entrance to home. Pt requiring up to min A with this PTAs support on R and cues for LE sequencing throughout stair training to maintain balance. Pt spouse present and supportive throughout session. Pt was educated on continued walker use to maximize functional independence, safety, and decrease risk for falls as well as ice, appropriate activity progression, safe car entry/exit and importance of continued mobility with pt verbalizing understanding. Anticipate safe discharge, with assist level outlined below, once medically cleared, will continue to follow acutely.     If plan is discharge home, recommend the following: A little help with bathing/dressing/bathroom;Assistance with cooking/housework;Assist for transportation;Help with stairs or ramp for entrance   Can travel by private vehicle        Equipment Recommendations  BSC/3in1    Recommendations for Other Services       Precautions / Restrictions Precautions Precautions: Fall Recall of Precautions/Restrictions: Intact Precaution/Restrictions Comments: direct anterior THA, no precautions Restrictions Weight Bearing Restrictions Per Provider Order: Yes RLE Weight Bearing Per Provider Order: Weight bearing as tolerated     Mobility  Bed Mobility Overal bed mobility: Needs Assistance Bed Mobility: Supine to Sit     Supine to sit: Min assist, HOB elevated, Used rails     General bed mobility comments: pt up in recliner on  arrival    Transfers Overall transfer level: Needs assistance Equipment used: Rolling walker (2 wheels) Transfers: Sit to/from Stand Sit to Stand: Contact guard assist           General transfer comment: pt standing from recliner chair x3 and BSC over commode with CGA for safety    Ambulation/Gait Ambulation/Gait assistance: Contact guard assist Gait Distance (Feet): 15 Feet (x2) Assistive device: Rolling walker (2 wheels) Gait Pattern/deviations: Step-through pattern, Step-to pattern Gait velocity: reduced     General Gait Details: distance limited for concentration on stair training   Stairs Stairs: Yes Stairs assistance: Min assist Stair Management: One rail Left, Step to pattern, Forwards Number of Stairs: 2 (x2) General stair comments: pt utilizing LUE on rail and RUE over this PTAs shoulder to simulate entrance steps to home with unilateral rail, pt needing cues for sequencing and min A to maintain balance, pt spouse present for education on technique   Wheelchair Mobility     Tilt Bed    Modified Rankin (Stroke Patients Only)       Balance Overall balance assessment: Needs assistance Sitting-balance support: No upper extremity supported, Feet supported Sitting balance-Leahy Scale: Good     Standing balance support: Bilateral upper extremity supported, Reliant on assistive device for balance Standing balance-Leahy Scale: Poor Standing balance comment: reliant on RW support                            Communication Communication Communication: No apparent difficulties  Cognition Arousal: Alert Behavior During Therapy: WFL for tasks assessed/performed   PT - Cognitive impairments: No apparent impairments  Following commands: Intact      Cueing Cueing Techniques: Verbal cues  Exercises      General Comments General comments (skin integrity, edema, etc.): VSS on RA, pt spouse Natalie Sanders present and  supportive      Pertinent Vitals/Pain Pain Assessment Pain Assessment: Faces Faces Pain Scale: Hurts little more Pain Location: R hip Pain Descriptors / Indicators: Sore, Grimacing, Guarding Pain Intervention(s): Monitored during session, Limited activity within patient's tolerance    Home Living                          Prior Function            PT Goals (current goals can now be found in the care plan section) Acute Rehab PT Goals Patient Stated Goal: to return to independence PT Goal Formulation: With patient Time For Goal Achievement: 08/06/24 Progress towards PT goals: Progressing toward goals    Frequency    7X/week      PT Plan      Co-evaluation              AM-PAC PT 6 Clicks Mobility   Outcome Measure  Help needed turning from your back to your side while in a flat bed without using bedrails?: A Little Help needed moving from lying on your back to sitting on the side of a flat bed without using bedrails?: A Little Help needed moving to and from a bed to a chair (including a wheelchair)?: A Little Help needed standing up from a chair using your arms (e.g., wheelchair or bedside chair)?: A Little Help needed to walk in hospital room?: A Little Help needed climbing 3-5 steps with a railing? : A Little 6 Click Score: 18    End of Session Equipment Utilized During Treatment: Gait belt Activity Tolerance: Patient tolerated treatment well Patient left: with call bell/phone within reach;in chair Nurse Communication: Mobility status PT Visit Diagnosis: Other abnormalities of gait and mobility (R26.89);Muscle weakness (generalized) (M62.81);Pain Pain - Right/Left: Right Pain - part of body: Hip     Time: 1340-1413 PT Time Calculation (min) (ACUTE ONLY): 33 min  Charges:    $Gait Training: 23-37 mins PT General Charges $$ ACUTE PT VISIT: 1 Visit                     Natalie Hoppes R. PTA Acute Rehabilitation Services Office:  [phone]   Natalie Sanders 08/03/2024, 2:44 PM

## 2024-08-03 NOTE — Discharge Summary (Signed)
 Patient ID: Natalie Sanders MRN: [id number] DOB/AGE: 1951/05/09 72 y.o.  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 female who was admitted 08/02/2024 for operative treatment ofUnilateral primary osteoarthritis, right hip. Patient has severe unremitting pain that affects sleep, daily activities, and work/hobbies. After pre-op clearance the patient was taken to the operating room on 08/02/2024 and underwent  Procedure(s): ARTHROPLASTY, HIP, TOTAL, ANTERIOR APPROACH.    Patient was given perioperative antibiotics:  Anti-infectives (From admission, onward)    Start     Dose/Rate Route Frequency Ordered Stop   08/03/24 1000  cephALEXin  (KEFLEX ) capsule 250 mg        250 mg Oral Daily 08/02/24 1314     08/02/24 1430  ceFAZolin  (ANCEF ) IVPB 2g/100 mL premix        2 g 200 mL/hr over 30 Minutes Intravenous Every 6 hours 08/02/24 1314 08/02/24 2034   08/02/24 1314  fluconazole  (DIFLUCAN ) tablet 150 mg        150 mg Oral Daily PRN 08/02/24 1314     08/02/24 0700  ceFAZolin  (ANCEF ) IVPB 2g/100 mL premix        2 g 200 mL/hr  over 30 Minutes Intravenous On call to O.R. 08/02/24 9350 08/02/24 9075        Patient was given sequential compression devices, early ambulation, and chemoprophylaxis to prevent DVT.  Inpatient Morphine Milligram Equivalents Per Day 8/26 - 8/27   Values displayed are in units of MME/Day    Order Start / End Date Yesterday Today    oxyCODONE  (Oxy IR/ROXICODONE ) immediate release tablet 5 mg 8/26 - 8/26 7.5 of Unknown --    oxyCODONE  (ROXICODONE ) 5 MG/5ML solution 5 mg 8/26 - 8/26 0 of Unknown --      Group total: 7.5 of Unknown     fentaNYL  (SUBLIMAZE ) injection 25-50 mcg 8/26 - 8/26 45 of 45-90 --    Daily Totals  52.5 of Unknown (at least 45-90) --    Calculation Errors     Order Type Date Details   oxyCODONE  (Oxy IR/ROXICODONE ) immediate release tablet 5 mg Ordered Dose -- Insufficient frequency information   oxyCODONE  (ROXICODONE ) 5 MG/5ML solution 5 mg Ordered Dose -- Insufficient frequency information            Patient benefited maximally from hospital stay and there were no complications.    Recent vital signs: Patient Vitals for the past 24 hrs:  BP Temp Temp src Pulse Resp SpO2  08/03/24 0748 (!) 118/54 97.7 F (36.5 C) Oral (!) 57 18 95 %  08/03/24 0425 (!) 127/54 98 F (  36.7 C) Oral 61 18 94 %  08/03/24 0040 (!) 127/54 98.3 F (36.8 C) Oral (!) 58 16 93 %  08/02/24 2032 (!) 118/59 97.9 F (36.6 C) Oral (!) 57 18 94 %     Recent laboratory studies:  Recent Labs    08/03/24 0505  WBC 13.4*  HGB 13.1  HCT 38.7  PLT 189  NA 138  K 4.1  CL 105  CO2 23  BUN 10  CREATININE 0.77  GLUCOSE 136*  CALCIUM 8.5*     Discharge Medications:   Allergies as of 08/03/2024       Reactions   Methylprednisolone Other (See Comments)   Causes hot flashes   Darvon [propoxyphene] Nausea And Vomiting   Liraglutide Nausea Only   Lorcaserin Other (See Comments)   HEADACHE   Naltrexone-bupropion Hcl Er Other (See Comments)   UNABLE TO SWALLOW TABLETS         Medication List     STOP taking these medications    aspirin  EC 325 MG tablet   baclofen  10 MG tablet Commonly known as: LIORESAL    traMADol  50 MG tablet Commonly known as: ULTRAM        TAKE these medications    acetaminophen  500 MG tablet Commonly known as: TYLENOL  Take 1,000 mg by mouth 2 (two) times daily as needed for moderate pain.   Bimzelx 160 MG/ML pen Generic drug: bimekizumab-bkzx Inject 320 mg into the skin every 8 (eight) weeks.   cephALEXin  250 MG capsule Commonly known as: KEFLEX  Take 250 mg by mouth daily.   cephALEXin  500 MG capsule Commonly known as: KEFLEX  Take 1 capsule (500 mg total) by mouth 4 (four) times daily.   fluconazole  150 MG tablet Commonly known as: DIFLUCAN  Take 150 mg by mouth daily as needed (for yeast due to Bimzelx).   HYDROcodone -acetaminophen  5-325 MG tablet Commonly known as: NORCO/VICODIN Take 0.5 tablets by mouth every 8 (eight) hours as needed for moderate pain (pain score 4-6).   ondansetron  4 MG tablet Commonly known as: Zofran  Take 1 tablet (4 mg total) by mouth every 8 (eight) hours as needed for nausea or vomiting.   OPTI-FREE REWETTING DROPS Soln Place 1 drop into both eyes daily as needed (dry eyes).   oxyCODONE  5 MG immediate release tablet Commonly known as: Oxy IR/ROXICODONE  Take 1-2 tablets (5-10 mg total) by mouth every 4 (four) hours as needed for moderate pain (pain score 4-6) (pain score 4-6).   pantoprazole  40 MG tablet Commonly known as: PROTONIX  Take 40 mg by mouth every evening.   PreserVision AREDS 2 Caps Take 1 capsule by mouth in the morning and at bedtime.   sennosides-docusate sodium  8.6-50 MG tablet Commonly known as: SENOKOT-S Take 2 tablets by mouth daily.   silver sulfADIAZINE 1 % cream Commonly known as: SILVADENE Apply 1 Application topically daily.   simvastatin  40 MG tablet Commonly known as: ZOCOR  Take 40 mg by mouth at bedtime.   Synthroid  75 MCG tablet Generic drug:  levothyroxine  Take 75 mcg by mouth daily before breakfast.   tiZANidine  4 MG tablet Commonly known as: Zanaflex  Take 1 tablet (4 mg total) by mouth every 6 (six) hours as needed for muscle spasms.   triamcinolone ointment 0.1 % Commonly known as: KENALOG Apply 1 Application topically daily as needed (itching).   Vitamin D (Ergocalciferol) 1.25 MG (50000 UNIT) Caps capsule Commonly known as: DRISDOL Take 50,000 Units by mouth every Saturday.   Xarelto  20 MG Tabs tablet Generic drug: rivaroxaban   Take 20 mg by mouth daily.               Durable Medical Equipment  (From admission, onward)           Start     Ordered   08/02/24 1315  DME 3 n 1  Once        08/02/24 1314   08/02/24 1315  DME Walker rolling  Once       Question Answer Comment  Walker: With 5 Inch Wheels   Patient needs a walker to treat with the following condition Status post total replacement of right hip      08/02/24 1314            Diagnostic Studies: DG Pelvis Portable Result Date: 08/02/2024 CLINICAL DATA:  Status post right hip replacement. EXAM: PORTABLE PELVIS 1-2 VIEWS COMPARISON:  None Available. FINDINGS: Right hip arthroplasty in expected alignment. No periprosthetic lucency or fracture. Recent postsurgical change includes air and edema in the soft tissues. IMPRESSION: Right hip arthroplasty without immediate postoperative complication. Electronically Signed   By: Andrea Gasman M.D.   On: 08/02/2024 12:45   DG HIP UNILAT WITH PELVIS 1V RIGHT Result Date: 08/02/2024 CLINICAL DATA:  Elective surgery. EXAM: DG HIP (WITH OR WITHOUT PELVIS) 1V RIGHT COMPARISON:  Radiograph 05/23/2024 FINDINGS: Three fluoroscopic spot views of the pelvis and right hip obtained in the operating room. Images during hip arthroplasty. Fluoroscopy time 19.2 seconds. Dose 3.31 mGy. IMPRESSION: Intraoperative fluoroscopy during right hip arthroplasty. Electronically Signed   By: Andrea Gasman M.D.   On:  08/02/2024 12:44   DG C-Arm 1-60 Min-No Report Result Date: 08/02/2024 Fluoroscopy was utilized by the requesting physician.  No radiographic interpretation.    Disposition: Discharge disposition: 01-Home or Self Care          Follow-up Information     Vernetta Lonni GRADE, MD Follow up in 2 week(s).   Specialty: Orthopedic Surgery Contact information: [address] [place]  [place] [address] [phone]         Andrew Truman GRADE., MD Follow up.   Specialty: Internal Medicine Contact information: [address] [place] [address] [phone]         [facility] Follow up.   Specialty: Home Health Services Why: home health PT services will be provided by [facility], start of care within 48 hours post discharge Contact information: [address] [place] [address] [phone]                  Signed: Lonni GRADE Vernetta 08/03/2024, 3:37 PM

## 2024-08-03 NOTE — TOC Transition Note (Signed)
 Transition of Care [facility]) - Discharge Note   Patient Details  Name: Natalie Sanders MRN: [id number] Date of Birth: [birth year]  Transition of [facility]) CM/SW Contact:  Rosalva Jon Bloch, RN Phone Number: 08/03/2024, 2:21 PM   Clinical Narrative:    Patient will DC un:ynfz Anticipated DC date: 08/03/2024 Family notified: yes Transport by:car   Per MD patient ready for DC today. RN, patient, patient's family, and  [facility] ( prearranged) notified of DC. Referral made with [employer] for DME. Equipment will be delivered to bedside prior to d/c. Pt without RX med concerns.  Family to provide transportation to home.  RNCM will sign off for now as intervention is no longer needed. Please consult us  again if new needs arise.     Barriers to Discharge: No Barriers Identified   Patient Goals and CMS Choice            Discharge Placement                       Discharge Plan and Services Additional resources added to the After Visit Summary for                  DME Arranged: 3-N-1, Walker rolling DME Agency: [facility] Date DME Agency Contacted: 08/03/24 Time DME Agency Contacted: 1412   HH Arranged: PT HH Agency: [facility] Date HH Agency Contacted: 08/03/24 Time HH Agency Contacted: 1418 Representative spoke with at [facility] Agency: Arna  Social Drivers of Health (SDOH) Interventions SDOH Screenings   Food Insecurity: No Food Insecurity (08/02/2024)  Housing: Low Risk  (08/02/2024)  Transportation Needs: No Transportation Needs (08/02/2024)  Utilities: Not At Risk (08/02/2024)  Financial Resource Strain: Unknown (06/21/2022)   Received from [facility] visits prior to 02/07/2023., [facility]  Physical Activity: Insufficiently Active (06/21/2022)   Received from [facility] visits prior to 02/07/2023., [facility]  Social Connections: Socially Integrated (08/02/2024)  Stress: No Stress Concern Present  (12/23/2023)   Received from [facility]  Tobacco Use: Medium Risk (08/02/2024)     Readmission Risk Interventions     No data to display

## 2024-08-03 NOTE — Plan of Care (Signed)
  Problem: Education: Goal: Knowledge of General Education information will improve Description: Including pain rating scale, medication(s)/side effects and non-pharmacologic comfort measures Outcome: Progressing   Problem: Activity: Goal: Risk for activity intolerance will decrease Outcome: Progressing   Problem: Skin Integrity: Goal: Risk for impaired skin integrity will decrease Outcome: Progressing   Problem: Activity: Goal: Ability to tolerate increased activity will improve Outcome: Progressing

## 2024-08-04 ENCOUNTER — Telehealth: Payer: Self-pay | Admitting: Orthopaedic Surgery

## 2024-08-04 NOTE — Telephone Encounter (Signed)
 Patient called. Should she be taking her Bemzelix shots? She would like a call.

## 2024-08-09 ENCOUNTER — Other Ambulatory Visit: Payer: Self-pay | Admitting: Radiology

## 2024-08-09 ENCOUNTER — Telehealth: Payer: Self-pay | Admitting: Radiology

## 2024-08-09 DIAGNOSIS — Z96641 Presence of right artificial hip joint: Secondary | ICD-10-CM

## 2024-08-09 DIAGNOSIS — M7989 Other specified soft tissue disorders: Secondary | ICD-10-CM

## 2024-08-09 NOTE — Telephone Encounter (Signed)
 Chris from [facility] HHPT called patient is having increased swelling and pain in right lower leg. I did place STAT order for VAS US  DOPPLER  [place] and patient aware she will be getting a call to schedule.  08/02/24 RT THA

## 2024-08-09 NOTE — Telephone Encounter (Signed)
 Patient states injection is for psoriasis

## 2024-08-10 ENCOUNTER — Ambulatory Visit (HOSPITAL_COMMUNITY)
Admission: RE | Admit: 2024-08-10 | Discharge: 2024-08-10 | Disposition: A | Discharge status: Home / Self Care | Source: Ambulatory Visit | Attending: Orthopaedic Surgery | Admitting: Orthopaedic Surgery

## 2024-08-10 DIAGNOSIS — M79661 Pain in right lower leg: Secondary | ICD-10-CM | POA: Diagnosis not present

## 2024-08-10 DIAGNOSIS — M7989 Other specified soft tissue disorders: Secondary | ICD-10-CM | POA: Insufficient documentation

## 2024-08-10 DIAGNOSIS — Z96641 Presence of right artificial hip joint: Secondary | ICD-10-CM | POA: Insufficient documentation

## 2024-08-15 ENCOUNTER — Ambulatory Visit (INDEPENDENT_AMBULATORY_CARE_PROVIDER_SITE_OTHER): Admitting: Orthopaedic Surgery

## 2024-08-15 ENCOUNTER — Encounter: Admitting: Orthopaedic Surgery

## 2024-08-15 ENCOUNTER — Encounter: Payer: Self-pay | Admitting: Orthopaedic Surgery

## 2024-08-15 DIAGNOSIS — Z96641 Presence of right artificial hip joint: Secondary | ICD-10-CM

## 2024-08-15 MED ORDER — HYDROCODONE-ACETAMINOPHEN 5-325 MG PO TABS: 1.0000 | ORAL_TABLET | Freq: Four times a day (QID) | ORAL | 0 refills | Status: DC | PRN

## 2024-08-15 NOTE — Progress Notes (Signed)
 The patient is a 73 year old female is here today for first postoperative visit status post a right total hip replacement to treat severe right hip end-stage arthritis.  She is ambulate with a walker.  She is on Xarelto  20 mg daily secondary to blood clots from earlier this year back in January.  She did have a recent Doppler for right leg swelling which was negative for DVT.  Even if it had been positive she is already on strong dose Xarelto .  This does cause a lot of bruising.  Therapy has been just a few times to her house.  She understands to go slow.  Her right hip incision looks good.  Staples removed Steri-Strips applied.  I showed her how would like her to put a gauze in her groin crease daily due to sweating and wetness in this area.  She will continue to slowly increase activities as comfort allows.  I will send in some hydrocodone  for pain.  Will see her back in a month to see how she is doing overall but no x-rays are needed.

## 2024-09-12 ENCOUNTER — Encounter: Payer: Self-pay | Admitting: Orthopaedic Surgery

## 2024-09-12 ENCOUNTER — Ambulatory Visit (INDEPENDENT_AMBULATORY_CARE_PROVIDER_SITE_OTHER): Admitting: Orthopaedic Surgery

## 2024-09-12 DIAGNOSIS — Z96641 Presence of right artificial hip joint: Secondary | ICD-10-CM

## 2024-09-12 MED ORDER — HYDROCODONE-ACETAMINOPHEN 5-325 MG PO TABS: 1.0000 | ORAL_TABLET | Freq: Four times a day (QID) | ORAL | 0 refills | Status: DC | PRN

## 2024-09-12 NOTE — Progress Notes (Signed)
 The patient is now 6 weeks status post a right total hip replacement.  She does ambit with a cane.  She says her right knee is hurting her and she does have a right knee replacement I think a lot of this is related to the replacement surgery itself on the right hip and how we do twist the knee around.  I did explain this to her.  She does have known arthritis in her left knee but not her left hip.  She states she is getting there.  She would like 1 more refill of hydrocodone  and she is using this sparingly.  Her right hip incision looks good.  There is one little retained suture that I did remove with no issue at all.  The incision otherwise looks good.  She tolerates just putting her right hip through range of motion.  The right knee shows no swelling.  From our standpoint the next time when he is here is not for 6 months unless she is having issues.  Will have a standing AP pelvis and lateral of her right hip at that visit.  I did send in some more hydrocodone  for her to take as needed.

## 2024-10-10 ENCOUNTER — Encounter: Payer: Self-pay | Admitting: Radiology

## 2024-10-12 ENCOUNTER — Other Ambulatory Visit: Payer: Self-pay | Admitting: Orthopaedic Surgery

## 2024-10-12 ENCOUNTER — Encounter: Payer: Self-pay | Admitting: Orthopaedic Surgery

## 2024-10-12 MED ORDER — PREDNISONE 50 MG PO TABS: ORAL_TABLET | ORAL | 0 refills | Status: DC

## 2024-10-25 ENCOUNTER — Other Ambulatory Visit: Payer: Self-pay

## 2024-12-29 ENCOUNTER — Encounter (HOSPITAL_COMMUNITY): Payer: Self-pay

## 2024-12-29 ENCOUNTER — Other Ambulatory Visit: Payer: Self-pay

## 2024-12-29 ENCOUNTER — Inpatient Hospital Stay (HOSPITAL_COMMUNITY)
Admission: EM | Admit: 2024-12-29 | Discharge: 2025-01-04 | DRG: 522 | Disposition: A | Discharge status: Home Health | Attending: Internal Medicine | Admitting: Internal Medicine

## 2024-12-29 DIAGNOSIS — Z87440 Personal history of urinary (tract) infections: Secondary | ICD-10-CM

## 2024-12-29 DIAGNOSIS — S72002A Fracture of unspecified part of neck of left femur, initial encounter for closed fracture: Principal | ICD-10-CM | POA: Diagnosis present

## 2024-12-29 DIAGNOSIS — Z96651 Presence of right artificial knee joint: Secondary | ICD-10-CM | POA: Diagnosis present

## 2024-12-29 DIAGNOSIS — R0689 Other abnormalities of breathing: Secondary | ICD-10-CM | POA: Diagnosis not present

## 2024-12-29 DIAGNOSIS — J449 Chronic obstructive pulmonary disease, unspecified: Secondary | ICD-10-CM | POA: Diagnosis present

## 2024-12-29 DIAGNOSIS — N390 Urinary tract infection, site not specified: Secondary | ICD-10-CM | POA: Diagnosis present

## 2024-12-29 DIAGNOSIS — S81811A Laceration without foreign body, right lower leg, initial encounter: Secondary | ICD-10-CM | POA: Diagnosis present

## 2024-12-29 DIAGNOSIS — K219 Gastro-esophageal reflux disease without esophagitis: Secondary | ICD-10-CM | POA: Diagnosis present

## 2024-12-29 DIAGNOSIS — Z7901 Long term (current) use of anticoagulants: Secondary | ICD-10-CM

## 2024-12-29 DIAGNOSIS — Y92008 Other place in unspecified non-institutional (private) residence as the place of occurrence of the external cause: Secondary | ICD-10-CM

## 2024-12-29 DIAGNOSIS — W010XXA Fall on same level from slipping, tripping and stumbling without subsequent striking against object, initial encounter: Secondary | ICD-10-CM | POA: Diagnosis present

## 2024-12-29 DIAGNOSIS — Z86718 Personal history of other venous thrombosis and embolism: Secondary | ICD-10-CM

## 2024-12-29 DIAGNOSIS — E7800 Pure hypercholesterolemia, unspecified: Secondary | ICD-10-CM | POA: Diagnosis present

## 2024-12-29 DIAGNOSIS — S51012A Laceration without foreign body of left elbow, initial encounter: Secondary | ICD-10-CM | POA: Diagnosis present

## 2024-12-29 DIAGNOSIS — Z96641 Presence of right artificial hip joint: Secondary | ICD-10-CM | POA: Diagnosis present

## 2024-12-29 DIAGNOSIS — Z79890 Hormone replacement therapy (postmenopausal): Secondary | ICD-10-CM

## 2024-12-29 DIAGNOSIS — J9811 Atelectasis: Secondary | ICD-10-CM | POA: Diagnosis not present

## 2024-12-29 DIAGNOSIS — D62 Acute posthemorrhagic anemia: Secondary | ICD-10-CM | POA: Diagnosis not present

## 2024-12-29 DIAGNOSIS — M1990 Unspecified osteoarthritis, unspecified site: Secondary | ICD-10-CM | POA: Diagnosis present

## 2024-12-29 DIAGNOSIS — Z87891 Personal history of nicotine dependence: Secondary | ICD-10-CM

## 2024-12-29 DIAGNOSIS — Z90710 Acquired absence of both cervix and uterus: Secondary | ICD-10-CM

## 2024-12-29 DIAGNOSIS — Z9049 Acquired absence of other specified parts of digestive tract: Secondary | ICD-10-CM

## 2024-12-29 DIAGNOSIS — R739 Hyperglycemia, unspecified: Secondary | ICD-10-CM

## 2024-12-29 DIAGNOSIS — E039 Hypothyroidism, unspecified: Secondary | ICD-10-CM | POA: Diagnosis present

## 2024-12-29 DIAGNOSIS — Z86711 Personal history of pulmonary embolism: Secondary | ICD-10-CM

## 2024-12-29 DIAGNOSIS — Z79899 Other long term (current) drug therapy: Secondary | ICD-10-CM

## 2024-12-29 NOTE — ED Triage Notes (Signed)
 Pt BIB [facility] from home for a fall in driveway. Pt has left leg pain, shorting & rotation at time of arrival. Right lower leg skin tear with bleeding controlled by pressure dressing. Pt c/o left elbow, left hip, right knee.  Denies blood thinners, or head injury.  EMS gave 100mcg of Fentanyl  PTA.  Pt A&O x4

## 2024-12-30 ENCOUNTER — Emergency Department (HOSPITAL_COMMUNITY)

## 2024-12-30 ENCOUNTER — Inpatient Hospital Stay (HOSPITAL_COMMUNITY)

## 2024-12-30 ENCOUNTER — Inpatient Hospital Stay (HOSPITAL_COMMUNITY): Admitting: Certified Registered"

## 2024-12-30 ENCOUNTER — Encounter (HOSPITAL_COMMUNITY)
Admission: EM | Disposition: A | Discharge status: Home Health | Payer: Self-pay | Source: Home / Self Care | Attending: Internal Medicine

## 2024-12-30 ENCOUNTER — Encounter (HOSPITAL_COMMUNITY): Payer: Self-pay | Admitting: Family Medicine

## 2024-12-30 DIAGNOSIS — M25552 Pain in left hip: Secondary | ICD-10-CM | POA: Diagnosis present

## 2024-12-30 DIAGNOSIS — Z87891 Personal history of nicotine dependence: Secondary | ICD-10-CM | POA: Diagnosis not present

## 2024-12-30 DIAGNOSIS — S72002A Fracture of unspecified part of neck of left femur, initial encounter for closed fracture: Secondary | ICD-10-CM | POA: Diagnosis present

## 2024-12-30 DIAGNOSIS — S81811A Laceration without foreign body, right lower leg, initial encounter: Secondary | ICD-10-CM | POA: Diagnosis present

## 2024-12-30 DIAGNOSIS — E785 Hyperlipidemia, unspecified: Secondary | ICD-10-CM | POA: Diagnosis not present

## 2024-12-30 DIAGNOSIS — Z96641 Presence of right artificial hip joint: Secondary | ICD-10-CM | POA: Diagnosis present

## 2024-12-30 DIAGNOSIS — Z90710 Acquired absence of both cervix and uterus: Secondary | ICD-10-CM | POA: Diagnosis not present

## 2024-12-30 DIAGNOSIS — E7800 Pure hypercholesterolemia, unspecified: Secondary | ICD-10-CM | POA: Diagnosis present

## 2024-12-30 DIAGNOSIS — E039 Hypothyroidism, unspecified: Secondary | ICD-10-CM | POA: Diagnosis present

## 2024-12-30 DIAGNOSIS — Z9049 Acquired absence of other specified parts of digestive tract: Secondary | ICD-10-CM | POA: Diagnosis not present

## 2024-12-30 DIAGNOSIS — Z86718 Personal history of other venous thrombosis and embolism: Secondary | ICD-10-CM | POA: Diagnosis not present

## 2024-12-30 DIAGNOSIS — K219 Gastro-esophageal reflux disease without esophagitis: Secondary | ICD-10-CM | POA: Diagnosis present

## 2024-12-30 DIAGNOSIS — R0689 Other abnormalities of breathing: Secondary | ICD-10-CM | POA: Diagnosis not present

## 2024-12-30 DIAGNOSIS — Z86711 Personal history of pulmonary embolism: Secondary | ICD-10-CM | POA: Diagnosis not present

## 2024-12-30 DIAGNOSIS — W010XXA Fall on same level from slipping, tripping and stumbling without subsequent striking against object, initial encounter: Secondary | ICD-10-CM | POA: Diagnosis present

## 2024-12-30 DIAGNOSIS — Z79899 Other long term (current) drug therapy: Secondary | ICD-10-CM | POA: Diagnosis not present

## 2024-12-30 DIAGNOSIS — Z96651 Presence of right artificial knee joint: Secondary | ICD-10-CM | POA: Diagnosis present

## 2024-12-30 DIAGNOSIS — Z79890 Hormone replacement therapy (postmenopausal): Secondary | ICD-10-CM | POA: Diagnosis not present

## 2024-12-30 DIAGNOSIS — Z7901 Long term (current) use of anticoagulants: Secondary | ICD-10-CM | POA: Diagnosis not present

## 2024-12-30 DIAGNOSIS — N390 Urinary tract infection, site not specified: Secondary | ICD-10-CM | POA: Diagnosis present

## 2024-12-30 DIAGNOSIS — I2693 Single subsegmental pulmonary embolism without acute cor pulmonale: Secondary | ICD-10-CM | POA: Diagnosis not present

## 2024-12-30 DIAGNOSIS — Y92008 Other place in unspecified non-institutional (private) residence as the place of occurrence of the external cause: Secondary | ICD-10-CM | POA: Diagnosis not present

## 2024-12-30 DIAGNOSIS — M1990 Unspecified osteoarthritis, unspecified site: Secondary | ICD-10-CM | POA: Diagnosis present

## 2024-12-30 DIAGNOSIS — S51012A Laceration without foreign body of left elbow, initial encounter: Secondary | ICD-10-CM | POA: Diagnosis present

## 2024-12-30 DIAGNOSIS — J9811 Atelectasis: Secondary | ICD-10-CM | POA: Diagnosis not present

## 2024-12-30 DIAGNOSIS — J449 Chronic obstructive pulmonary disease, unspecified: Secondary | ICD-10-CM | POA: Diagnosis present

## 2024-12-30 DIAGNOSIS — Z87440 Personal history of urinary (tract) infections: Secondary | ICD-10-CM | POA: Diagnosis not present

## 2024-12-30 DIAGNOSIS — D62 Acute posthemorrhagic anemia: Secondary | ICD-10-CM | POA: Diagnosis not present

## 2024-12-30 LAB — BASIC METABOLIC PANEL WITH GFR
Anion gap: 11 (ref 5–15)
Anion gap: 10 (ref 5–15)
BUN: 15 mg/dL (ref 8–23)
BUN: 14 mg/dL (ref 8–23)
CO2: 25 mmol/L (ref 22–32)
CO2: 24 mmol/L (ref 22–32)
Calcium: 9 mg/dL (ref 8.9–10.3)
Calcium: 8.7 mg/dL — ABNORMAL LOW (ref 8.9–10.3)
Chloride: 103 mmol/L (ref 98–111)
Chloride: 104 mmol/L (ref 98–111)
Creatinine, Ser: 0.92 mg/dL (ref 0.44–1.00)
Creatinine, Ser: 0.75 mg/dL (ref 0.44–1.00)
GFR, Estimated: >60 mL/min
GFR, Estimated: >60 mL/min
Glucose, Bld: 121 mg/dL — ABNORMAL HIGH (ref 70–99)
Glucose, Bld: 156 mg/dL — ABNORMAL HIGH (ref 70–99)
Potassium: 3.9 mmol/L (ref 3.5–5.1)
Potassium: 4.1 mmol/L (ref 3.5–5.1)
Sodium: 139 mmol/L (ref 135–145)
Sodium: 138 mmol/L (ref 135–145)

## 2024-12-30 LAB — CBC
HCT: 41.6 % (ref 36.0–46.0)
Hemoglobin: 13.7 g/dL (ref 12.0–15.0)
MCH: 29.7 pg (ref 26.0–34.0)
MCHC: 32.9 g/dL (ref 30.0–36.0)
MCV: 90 fL (ref 80.0–100.0)
Platelets: 129 K/uL — ABNORMAL LOW (ref 150–400)
RBC: 4.62 MIL/uL (ref 3.87–5.11)
RDW: 13.1 % (ref 11.5–15.5)
WBC: 12.7 K/uL — ABNORMAL HIGH (ref 4.0–10.5)
nRBC: 0 % (ref 0.0–0.2)

## 2024-12-30 LAB — SURGICAL PCR SCREEN
MRSA, PCR: NEGATIVE
Staphylococcus aureus: NEGATIVE

## 2024-12-30 LAB — TYPE AND SCREEN
ABO/RH(D): A NEG
Antibody Screen: NEGATIVE

## 2024-12-30 LAB — CBC WITH DIFFERENTIAL/PLATELET
Abs Immature Granulocytes: 0.05 K/uL (ref 0.00–0.07)
Basophils Absolute: 0 K/uL (ref 0.0–0.1)
Basophils Relative: 0 %
Eosinophils Absolute: 0.1 K/uL (ref 0.0–0.5)
Eosinophils Relative: 1 %
HCT: 46.4 % — ABNORMAL HIGH (ref 36.0–46.0)
Hemoglobin: 14.7 g/dL (ref 12.0–15.0)
Immature Granulocytes: 1 %
Lymphocytes Relative: 13 %
Lymphs Abs: 1.5 K/uL (ref 0.7–4.0)
MCH: 29.3 pg (ref 26.0–34.0)
MCHC: 31.7 g/dL (ref 30.0–36.0)
MCV: 92.6 fL (ref 80.0–100.0)
Monocytes Absolute: 0.7 K/uL (ref 0.1–1.0)
Monocytes Relative: 6 %
Neutro Abs: 8.8 K/uL — ABNORMAL HIGH (ref 1.7–7.7)
Neutrophils Relative %: 79 %
Platelets: 164 K/uL (ref 150–400)
RBC: 5.01 MIL/uL (ref 3.87–5.11)
RDW: 13.1 % (ref 11.5–15.5)
WBC: 11.1 K/uL — ABNORMAL HIGH (ref 4.0–10.5)
nRBC: 0 % (ref 0.0–0.2)

## 2024-12-30 LAB — PROTIME-INR
INR: 0.9 (ref 0.8–1.2)
Prothrombin Time: 13.1 s (ref 11.4–15.2)

## 2024-12-30 MED ORDER — POLYETHYLENE GLYCOL 3350 17 G PO PACK: 17.0000 g | PACK | Freq: Every day | ORAL | Status: DC | PRN

## 2024-12-30 MED ORDER — CHLORHEXIDINE GLUCONATE 0.12 % MT SOLN: 15.0000 mL | Freq: Once | OROMUCOSAL | Status: AC

## 2024-12-30 MED ORDER — ACETAMINOPHEN 325 MG PO TABS
325.0000 mg | ORAL_TABLET | Freq: Four times a day (QID) | ORAL | Status: DC | PRN
  Administered 2025-01-03 – 2025-01-04 (×2): 650 mg via ORAL
  Filled 2024-12-30 (×2): qty 2

## 2024-12-30 MED ORDER — OXYCODONE HCL 5 MG PO TABS: 5.0000 mg | ORAL_TABLET | ORAL | Status: DC | PRN

## 2024-12-30 MED ORDER — PROPOFOL 10 MG/ML IV BOLUS
INTRAVENOUS | Status: DC | PRN
  Administered 2024-12-30: 30 mg via INTRAVENOUS

## 2024-12-30 MED ORDER — ONDANSETRON HCL 4 MG/2ML IJ SOLN
4.0000 mg | Freq: Four times a day (QID) | INTRAMUSCULAR | Status: DC | PRN
  Administered 2024-12-30 – 2025-01-03 (×3): 4 mg via INTRAVENOUS
  Filled 2024-12-30 (×3): qty 2

## 2024-12-30 MED ORDER — OXYCODONE HCL 5 MG PO TABS: 5.0000 mg | ORAL_TABLET | Freq: Once | ORAL | Status: DC | PRN

## 2024-12-30 MED ORDER — MORPHINE SULFATE (PF) 2 MG/ML IV SOLN
0.5000 mg | INTRAVENOUS | Status: DC | PRN
  Administered 2024-12-30: 1 mg via INTRAVENOUS
  Administered 2025-01-01: 0.5 mg via INTRAVENOUS
  Administered 2025-01-02: 1 mg via INTRAVENOUS
  Filled 2024-12-30 (×3): qty 1

## 2024-12-30 MED ORDER — HYDROCODONE-ACETAMINOPHEN 7.5-325 MG PO TABS
1.0000 | ORAL_TABLET | Freq: Every day | ORAL | Status: DC | PRN
  Administered 2024-12-30: 2 via ORAL
  Filled 2024-12-30: qty 2

## 2024-12-30 MED ORDER — SODIUM CHLORIDE 0.9 % IV SOLN: INTRAVENOUS | Status: AC

## 2024-12-30 MED ORDER — ONDANSETRON HCL 4 MG/2ML IJ SOLN: 4.0000 mg | Freq: Once | INTRAMUSCULAR | Status: DC | PRN

## 2024-12-30 MED ORDER — PANTOPRAZOLE SODIUM 40 MG IV SOLR
40.0000 mg | Freq: Once | INTRAVENOUS | Status: AC
  Administered 2024-12-30: 40 mg via INTRAVENOUS
  Filled 2024-12-30: qty 10

## 2024-12-30 MED ORDER — DEXAMETHASONE SOD PHOSPHATE PF 10 MG/ML IJ SOLN
INTRAMUSCULAR | Status: DC | PRN
  Administered 2024-12-30: 10 mg via INTRAVENOUS

## 2024-12-30 MED ORDER — PRONTOSAN WOUND IRRIGATION OPTIME
TOPICAL | Status: DC | PRN
  Administered 2024-12-30: 350 mL via TOPICAL

## 2024-12-30 MED ORDER — MENTHOL 3 MG MT LOZG: 1.0000 | LOZENGE | OROMUCOSAL | Status: DC | PRN

## 2024-12-30 MED ORDER — MIDAZOLAM HCL 5 MG/5ML IJ SOLN
INTRAMUSCULAR | Status: DC | PRN
  Administered 2024-12-30 (×2): .5 mg via INTRAVENOUS

## 2024-12-30 MED ORDER — PROPOFOL 500 MG/50ML IV EMUL
INTRAVENOUS | Status: DC | PRN
  Administered 2024-12-30: 55 ug/kg/min via INTRAVENOUS

## 2024-12-30 MED ORDER — SENNOSIDES-DOCUSATE SODIUM 8.6-50 MG PO TABS: 2.0000 | ORAL_TABLET | Freq: Every day | ORAL | Status: DC

## 2024-12-30 MED ORDER — PROCHLORPERAZINE EDISYLATE 10 MG/2ML IJ SOLN
5.0000 mg | Freq: Four times a day (QID) | INTRAMUSCULAR | Status: DC | PRN
  Administered 2024-12-30: 5 mg via INTRAVENOUS
  Filled 2024-12-30: qty 2

## 2024-12-30 MED ORDER — PHENYLEPHRINE HCL-NACL 20-0.9 MG/250ML-% IV SOLN
INTRAVENOUS | Status: DC | PRN
  Administered 2024-12-30: 65 ug/min via INTRAVENOUS

## 2024-12-30 MED ORDER — CEFAZOLIN SODIUM-DEXTROSE 2-4 GM/100ML-% IV SOLN
2.0000 g | INTRAVENOUS | Status: AC
  Administered 2024-12-30: 2 g via INTRAVENOUS

## 2024-12-30 MED ORDER — TRANEXAMIC ACID-NACL 1000-0.7 MG/100ML-% IV SOLN
INTRAVENOUS | Status: AC
  Filled 2024-12-30: qty 100

## 2024-12-30 MED ORDER — SIMVASTATIN 20 MG PO TABS: 40.0000 mg | ORAL_TABLET | Freq: Every day | ORAL | Status: DC

## 2024-12-30 MED ORDER — LACTATED RINGERS IV SOLN: INTRAVENOUS | Status: DC

## 2024-12-30 MED ORDER — VANCOMYCIN HCL 1 G IV SOLR
INTRAVENOUS | Status: DC | PRN
  Administered 2024-12-30: 1000 mg via TOPICAL

## 2024-12-30 MED ORDER — POLYETHYLENE GLYCOL 3350 17 G PO PACK: 17.0000 g | PACK | Freq: Every day | ORAL | Status: DC

## 2024-12-30 MED ORDER — BUPIVACAINE IN DEXTROSE 0.75-8.25 % IT SOLN
INTRATHECAL | Status: DC | PRN
  Administered 2024-12-30: 1.6 mL via INTRATHECAL

## 2024-12-30 MED ORDER — MIDAZOLAM HCL 2 MG/2ML IJ SOLN
INTRAMUSCULAR | Status: AC
  Filled 2024-12-30: qty 2

## 2024-12-30 MED ORDER — SENNOSIDES-DOCUSATE SODIUM 8.6-50 MG PO TABS: 1.0000 | ORAL_TABLET | Freq: Every evening | ORAL | Status: DC | PRN

## 2024-12-30 MED ORDER — PANTOPRAZOLE SODIUM 40 MG PO TBEC: 40.0000 mg | DELAYED_RELEASE_TABLET | Freq: Every evening | ORAL | Status: DC

## 2024-12-30 MED ORDER — HYDROMORPHONE HCL 1 MG/ML IJ SOLN
INTRAMUSCULAR | Status: AC
  Filled 2024-12-30: qty 1

## 2024-12-30 MED ORDER — OXYCODONE-ACETAMINOPHEN 5-325 MG PO TABS: 1.0000 | ORAL_TABLET | Freq: Three times a day (TID) | ORAL | 0 refills | Status: DC | PRN

## 2024-12-30 MED ORDER — TRANEXAMIC ACID-NACL 1000-0.7 MG/100ML-% IV SOLN
1000.0000 mg | Freq: Once | INTRAVENOUS | Status: AC
  Administered 2024-12-30: 1000 mg via INTRAVENOUS
  Filled 2024-12-30: qty 100

## 2024-12-30 MED ORDER — ORAL CARE MOUTH RINSE: 15.0000 mL | Freq: Once | OROMUCOSAL | Status: AC

## 2024-12-30 MED ORDER — ACETAMINOPHEN 500 MG PO TABS
500.0000 mg | ORAL_TABLET | Freq: Four times a day (QID) | ORAL | Status: AC
  Administered 2024-12-30: 500 mg via ORAL
  Filled 2024-12-30: qty 1

## 2024-12-30 MED ORDER — CHLORHEXIDINE GLUCONATE 0.12 % MT SOLN
OROMUCOSAL | Status: AC
  Administered 2024-12-30: 15 mL via OROMUCOSAL
  Filled 2024-12-30: qty 15

## 2024-12-30 MED ORDER — HYDROMORPHONE HCL 1 MG/ML IJ SOLN: 0.2500 mg | INTRAMUSCULAR | Status: DC | PRN

## 2024-12-30 MED ORDER — LEVOTHYROXINE SODIUM 75 MCG PO TABS
75.0000 ug | ORAL_TABLET | Freq: Every day | ORAL | Status: DC
  Administered 2024-12-31 – 2025-01-04 (×5): 75 ug via ORAL
  Filled 2024-12-30 (×5): qty 1

## 2024-12-30 MED ORDER — PHENOL 1.4 % MT LIQD: 1.0000 | OROMUCOSAL | Status: DC | PRN

## 2024-12-30 MED ORDER — BISACODYL 5 MG PO TBEC: 5.0000 mg | DELAYED_RELEASE_TABLET | Freq: Every day | ORAL | Status: DC | PRN

## 2024-12-30 MED ORDER — DOCUSATE SODIUM 100 MG PO CAPS
100.0000 mg | ORAL_CAPSULE | Freq: Two times a day (BID) | ORAL | Status: DC
  Administered 2024-12-30 – 2025-01-01 (×4): 100 mg via ORAL
  Filled 2024-12-30 (×4): qty 1

## 2024-12-30 MED ORDER — ONDANSETRON HCL 4 MG/2ML IJ SOLN
4.0000 mg | Freq: Once | INTRAMUSCULAR | Status: AC
  Administered 2024-12-30: 4 mg via INTRAVENOUS
  Filled 2024-12-30: qty 2

## 2024-12-30 MED ORDER — SORBITOL 70 % SOLN: 30.0000 mL | Freq: Every day | Status: DC | PRN

## 2024-12-30 MED ORDER — ALUM & MAG HYDROXIDE-SIMETH 200-200-20 MG/5ML PO SUSP
30.0000 mL | ORAL | Status: DC | PRN
  Filled 2024-12-30: qty 30

## 2024-12-30 MED ORDER — TRANEXAMIC ACID-NACL 1000-0.7 MG/100ML-% IV SOLN
1000.0000 mg | INTRAVENOUS | Status: AC
  Administered 2024-12-30: 1000 mg via INTRAVENOUS

## 2024-12-30 MED ORDER — ONDANSETRON HCL 4 MG/2ML IJ SOLN
4.0000 mg | Freq: Once | INTRAMUSCULAR | Status: AC
  Administered 2024-12-30: 4 mg via INTRAVENOUS

## 2024-12-30 MED ORDER — FENTANYL CITRATE (PF) 50 MCG/ML IJ SOSY: 12.5000 ug | PREFILLED_SYRINGE | INTRAMUSCULAR | Status: DC | PRN

## 2024-12-30 MED ORDER — LIDOCAINE 2% (20 MG/ML) 5 ML SYRINGE
INTRAMUSCULAR | Status: DC | PRN
  Administered 2024-12-30: 40 mg via INTRAVENOUS

## 2024-12-30 MED ORDER — TRANEXAMIC ACID 1000 MG/10ML IV SOLN
INTRAVENOUS | Status: DC | PRN
  Administered 2024-12-30: 2000 mg via TOPICAL

## 2024-12-30 MED ORDER — PHENYLEPHRINE 80 MCG/ML (10ML) SYRINGE FOR IV PUSH (FOR BLOOD PRESSURE SUPPORT)
PREFILLED_SYRINGE | INTRAVENOUS | Status: DC | PRN
  Administered 2024-12-30 (×2): 160 ug via INTRAVENOUS

## 2024-12-30 MED ORDER — FENTANYL CITRATE (PF) 250 MCG/5ML IJ SOLN
INTRAMUSCULAR | Status: DC | PRN
  Administered 2024-12-30: 50 ug via INTRAVENOUS

## 2024-12-30 MED ORDER — METHOCARBAMOL 1000 MG/10ML IJ SOLN
500.0000 mg | Freq: Three times a day (TID) | INTRAMUSCULAR | Status: DC | PRN
  Administered 2025-01-01: 500 mg via INTRAVENOUS
  Filled 2024-12-30 (×2): qty 10

## 2024-12-30 MED ORDER — CEFAZOLIN SODIUM-DEXTROSE 2-4 GM/100ML-% IV SOLN
INTRAVENOUS | Status: AC
  Filled 2024-12-30: qty 100

## 2024-12-30 MED ORDER — CEPHALEXIN 250 MG PO CAPS
250.0000 mg | ORAL_CAPSULE | Freq: Every day | ORAL | Status: DC
  Administered 2024-12-31 – 2025-01-04 (×5): 250 mg via ORAL
  Filled 2024-12-30 (×5): qty 1

## 2024-12-30 MED ORDER — BUPIVACAINE-MELOXICAM ER 400-12 MG/14ML IJ SOLN
INTRAMUSCULAR | Status: DC | PRN
  Administered 2024-12-30: 400 mg

## 2024-12-30 MED ORDER — ENOXAPARIN SODIUM 40 MG/0.4ML IJ SOSY: 40.0000 mg | PREFILLED_SYRINGE | Freq: Every day | INTRAMUSCULAR | 0 refills | Status: DC

## 2024-12-30 MED ORDER — ENOXAPARIN SODIUM 60 MG/0.6ML IJ SOSY
45.0000 mg | PREFILLED_SYRINGE | INTRAMUSCULAR | Status: DC
  Administered 2024-12-30: 45 mg via SUBCUTANEOUS
  Filled 2024-12-30: qty 0.6

## 2024-12-30 MED ORDER — SODIUM CHLORIDE 0.9 % IV SOLN: INTRAVENOUS | Status: DC

## 2024-12-30 MED ORDER — ONDANSETRON HCL 4 MG PO TABS
4.0000 mg | ORAL_TABLET | Freq: Four times a day (QID) | ORAL | Status: DC | PRN
  Administered 2025-01-02 – 2025-01-03 (×2): 4 mg via ORAL
  Filled 2024-12-30 (×2): qty 1

## 2024-12-30 MED ORDER — MORPHINE SULFATE (PF) 4 MG/ML IV SOLN
4.0000 mg | INTRAVENOUS | Status: AC | PRN
  Administered 2024-12-30 (×3): 4 mg via INTRAVENOUS
  Filled 2024-12-30 (×3): qty 1

## 2024-12-30 MED ORDER — HYDROMORPHONE HCL 1 MG/ML IJ SOLN
1.0000 mg | Freq: Once | INTRAMUSCULAR | Status: AC
  Administered 2024-12-30: 1 mg via INTRAVENOUS

## 2024-12-30 MED ORDER — METHOCARBAMOL 500 MG PO TABS
500.0000 mg | ORAL_TABLET | Freq: Three times a day (TID) | ORAL | Status: DC | PRN
  Administered 2024-12-31 – 2025-01-04 (×7): 500 mg via ORAL
  Filled 2024-12-30 (×7): qty 1

## 2024-12-30 MED ORDER — POVIDONE-IODINE 10 % EX SWAB
2.0000 | Freq: Once | CUTANEOUS | Status: AC
  Administered 2024-12-30: 2 via TOPICAL

## 2024-12-30 MED ORDER — SODIUM CHLORIDE 0.9 % IR SOLN
Status: DC | PRN
  Administered 2024-12-30: 3000 mL

## 2024-12-30 MED ORDER — BUPIVACAINE-MELOXICAM ER 400-12 MG/14ML IJ SOLN
INTRAMUSCULAR | Status: AC
  Filled 2024-12-30: qty 1

## 2024-12-30 MED ORDER — ONDANSETRON HCL 4 MG/2ML IJ SOLN
INTRAMUSCULAR | Status: AC
  Filled 2024-12-30: qty 2

## 2024-12-30 MED ORDER — ENOXAPARIN SODIUM 40 MG/0.4ML IJ SOSY: 40.0000 mg | PREFILLED_SYRINGE | INTRAMUSCULAR | Status: DC

## 2024-12-30 MED ORDER — HYDROCODONE-ACETAMINOPHEN 5-325 MG PO TABS
1.0000 | ORAL_TABLET | Freq: Two times a day (BID) | ORAL | Status: DC | PRN
  Administered 2024-12-31: 2 via ORAL
  Filled 2024-12-30: qty 2

## 2024-12-30 MED ORDER — MORPHINE SULFATE (PF) 2 MG/ML IV SOLN: 0.5000 mg | INTRAVENOUS | Status: DC | PRN

## 2024-12-30 MED ORDER — MAGNESIUM CITRATE PO SOLN: 1.0000 | Freq: Once | ORAL | Status: DC | PRN

## 2024-12-30 MED ORDER — 0.9 % SODIUM CHLORIDE (POUR BTL) OPTIME
TOPICAL | Status: DC | PRN
  Administered 2024-12-30: 1000 mL

## 2024-12-30 MED ORDER — OXYCODONE HCL 5 MG/5ML PO SOLN: 5.0000 mg | Freq: Once | ORAL | Status: DC | PRN

## 2024-12-30 MED ORDER — CEFAZOLIN SODIUM-DEXTROSE 2-4 GM/100ML-% IV SOLN
2.0000 g | Freq: Four times a day (QID) | INTRAVENOUS | Status: AC
  Administered 2024-12-30 – 2024-12-31 (×3): 2 g via INTRAVENOUS
  Filled 2024-12-30 (×3): qty 100

## 2024-12-30 MED ORDER — CEFAZOLIN SODIUM-DEXTROSE 2-4 GM/100ML-% IV SOLN
2.0000 g | Freq: Four times a day (QID) | INTRAVENOUS | Status: DC
  Filled 2024-12-30: qty 100

## 2024-12-30 MED ORDER — TRANEXAMIC ACID 1000 MG/10ML IV SOLN
2000.0000 mg | INTRAVENOUS | Status: DC
  Filled 2024-12-30: qty 20

## 2024-12-30 MED ORDER — FENTANYL CITRATE (PF) 100 MCG/2ML IJ SOLN
INTRAMUSCULAR | Status: AC
  Filled 2024-12-30: qty 2

## 2024-12-30 NOTE — Anesthesia Postprocedure Evaluation (Signed)
"   Anesthesia Post Note  Patient: JACQUALINE WEICHEL  Procedure(s) Performed: LEFT TOTAL HIP ARTHROPLASTY (Left: Hip)     Patient location during evaluation: PACU Anesthesia Type: Spinal and MAC Level of consciousness: awake and alert and oriented Pain management: pain level controlled Vital Signs Assessment: post-procedure vital signs reviewed and stable Respiratory status: spontaneous breathing, nonlabored ventilation and respiratory function stable Cardiovascular status: blood pressure returned to baseline and stable Postop Assessment: no headache, no backache, spinal receding and patient able to bend at knees Anesthetic complications: no   No notable events documented.  Last Vitals:  Vitals:   12/30/24 1530 12/30/24 1545  BP: (!) 149/82 135/68  Pulse: 77 84  Resp: 11 10  Temp:    SpO2: 100% 93%    Last Pain:  Vitals:   12/30/24 1545  TempSrc:   PainSc: 0-No pain                 Almarie CHRISTELLA Tiombe Tomeo      "

## 2024-12-30 NOTE — Progress Notes (Signed)
 Return call from ED, nurse asked to bring pt to Short Stay room 34

## 2024-12-30 NOTE — Progress Notes (Signed)
 Attempting to reach ED for report > . Nurse unavailable to give report and stated they would call back. ED charge called per OR desk.

## 2024-12-30 NOTE — ED Notes (Signed)
Report given to short stay RN  

## 2024-12-30 NOTE — Anesthesia Procedure Notes (Signed)
 Spinal  Patient location during procedure: OR Start time: 12/30/2024 1:02 PM End time: 12/30/2024 1:06 PM Reason for block: surgical anesthesia  Staffing Performed: anesthesiologist  Authorized by: Jerrye Sharper, MD   Performed by: Jerrye Sharper, MD  Preanesthetic Checklist Completed: patient identified, IV checked, site marked, risks and benefits discussed, surgical consent, monitors and equipment checked, pre-op evaluation and timeout performed Spinal Block Patient position: left lateral decubitus Prep: DuraPrep and site prepped and draped Patient monitoring: heart rate, cardiac monitor, continuous pulse ox and blood pressure Approach: midline Location: L3-4 Injection technique: single-shot Needle Needle type: Sprotte and Pencan  Needle gauge: 24 G Needle length: 9 cm Needle insertion depth (cm): 7 Assessment Sensory level: T4 Events: CSF return  Additional Notes Patient tolerated procedure well. Adequate sensory level.

## 2024-12-30 NOTE — ED Notes (Signed)
 X-ray at bedside

## 2024-12-30 NOTE — Discharge Instructions (Signed)

## 2024-12-30 NOTE — Consult Note (Addendum)
 Reason for Consult:Left hip fx Referring Physician: Donalda Sanders Time called: 0730 Time at bedside: 0801   Natalie Sanders is an 74 y.o. female.  HPI: Natalie Sanders was taking her dog out last night when she tripped and fell. She had immediate left hip pain and could not get up. She was brought to the ED where x-rays showed a left hip fx and orthopedic surgery was consulted. She lives at home with her husband and does not use any assistive devices to ambulate.  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   Frequent UTI    takes keflex  daily   Full dentures    GERD (gastroesophageal reflux disease)    High cholesterol    History of hiatal hernia    Hypothyroidism    Primary localized osteoarthritis of right knee 06/15/2018    Past Surgical History:  Procedure Laterality Date   ABDOMINAL HYSTERECTOMY     partial   ANKLE RECONSTRUCTION Left 02/23/2014   Procedure: LEFT FIFTH BASE EXOSTOSIS/PERONEUS BREVIS REPAIR;  Surgeon: Norleen Armor, MD;  Location: [facility];  Service: Orthopedics;  Laterality: Left;   ANTERIOR CERVICAL DECOMP/DISCECTOMY FUSION N/A 04/16/2017   Procedure: ANTERIOR CERVICAL DECOMPRESSION/FUSION CERVICAL FIVE-SIX, CERVICAL SIX-SEVEN;  Surgeon: Louis Shove, MD;  Location: [facility];  Service: Neurosurgery;  Laterality: N/A;  right side approach   APPENDECTOMY     CHOLECYSTECTOMY  2005   ESOPHAGOGASTRODUODENOSCOPY ENDOSCOPY  03/2017   IR ESOPHAGUS DILITATION RETRO FLUORO     TOTAL HIP ARTHROPLASTY Right 08/02/2024   Procedure: ARTHROPLASTY, HIP, TOTAL, ANTERIOR APPROACH;  Surgeon: Vernetta Lonni GRADE, MD;  Location: [facility] OR;  Service: Orthopedics;  Laterality: Right;   TOTAL KNEE ARTHROPLASTY Right 06/15/2018   Procedure: RIGHT TOTAL  KNEE ARTHROPLASTY;  Surgeon: Josefina Chew, MD;  Location: [facility];  Service: Orthopedics;  Laterality: Right;    History reviewed. No pertinent family history.  Social History:  reports that she quit smoking about 18 years ago. Her smoking use included cigarettes. She has never used smokeless tobacco. She reports that she does not drink alcohol and does not use drugs.  Allergies: Allergies[1]  Medications: I have reviewed the patient's current medications.  Results for orders placed or performed during the hospital encounter of 12/29/24 (from the past 48 hours)  Type and screen [facility]     Status: None   Collection Time: 12/30/24  1:00 AM  Result Value Ref Range   ABO/RH(D) A NEG    Antibody Screen NEG    Sample Expiration      01/02/2025,2359 Performed at [facility], [address] [place], [place] [address]   Basic metabolic panel     Status: Abnormal   Collection Time: 12/30/24  1:01 AM  Result Value Ref Range   Sodium 139 135 - 145 mmol/L   Potassium 3.9 3.5 - 5.1 mmol/L   Chloride 103 98 - 111 mmol/L   CO2 25 22 - 32 mmol/L   Glucose, Bld 121 (H) 70 - 99 mg/dL    Comment: Glucose reference range applies only to samples taken after fasting for at least 8 hours.   BUN 15 8 - 23 mg/dL   Creatinine, Ser 9.07 0.44 - 1.00 mg/dL  Calcium 9.0 8.9 - 10.3 mg/dL   GFR, Estimated >39 >39 mL/min    Comment: (NOTE) Calculated using the CKD-EPI Creatinine Equation (2021)    Anion gap 11 5 - 15    Comment: Performed at [facility], [address], [place], [place] [address]  CBC with Differential     Status: Abnormal   Collection Time: 12/30/24  1:01 AM  Result Value Ref Range   WBC 11.1 (H) 4.0 - 10.5 K/uL   RBC 5.01 3.87 - 5.11 MIL/uL   Hemoglobin 14.7 12.0 - 15.0 g/dL   HCT 53.5 (H) 63.9 - 53.9 %   MCV 92.6 80.0 - 100.0 fL   MCH 29.3 26.0 - 34.0 pg   MCHC 31.7 30.0 - 36.0 g/dL   RDW 86.8 88.4 - 84.4 %   Platelets 164 150 - 400 K/uL    nRBC 0.0 0.0 - 0.2 %   Neutrophils Relative % 79 %   Neutro Abs 8.8 (H) 1.7 - 7.7 K/uL   Lymphocytes Relative 13 %   Lymphs Abs 1.5 0.7 - 4.0 K/uL   Monocytes Relative 6 %   Monocytes Absolute 0.7 0.1 - 1.0 K/uL   Eosinophils Relative 1 %   Eosinophils Absolute 0.1 0.0 - 0.5 K/uL   Basophils Relative 0 %   Basophils Absolute 0.0 0.0 - 0.1 K/uL   Immature Granulocytes 1 %   Abs Immature Granulocytes 0.05 0.00 - 0.07 K/uL    Comment: Performed at [facility], [address] [place], [place] [address]  Protime-INR     Status: None   Collection Time: 12/30/24  1:01 AM  Result Value Ref Range   Prothrombin Time 13.1 11.4 - 15.2 seconds   INR 0.9 0.8 - 1.2    Comment: (NOTE) INR goal varies based on device and disease states. Performed at [facility], [address] [place], [place] [address]     CT HIP LEFT WO CONTRAST Result Date: 12/30/2024 CLINICAL DATA:  Fall.  Fracture, hip EXAM: CT OF THE LEFT HIP WITHOUT CONTRAST TECHNIQUE: Multidetector CT imaging of the left hip was performed according to the standard protocol. Multiplanar CT image reconstructions were also generated. RADIATION DOSE REDUCTION: This exam was performed according to the departmental dose-optimization program which includes automated exposure control, adjustment of the mA and/or kV according to patient size and/or use of iterative reconstruction technique. COMPARISON:  Earlier same day radiographs FINDINGS: Bones/Joint/Cartilage Acute impacted subcapital fracture of the left femoral head neck junction with mild apex anterior angulation. The left femoral head is seated within the acetabulum. The left sacroiliac joint and pubic symphysis are anatomically aligned. Mild osteoarthritis of the left hip. Ligaments Ligaments are suboptimally evaluated by CT. Muscles and Tendons Edema within the left obturator externus muscle. No discrete intramuscular collection. Soft tissue No fluid collection or hematoma.   Aortoiliac atherosclerosis. IMPRESSION: 1. Acute impacted subcapital fracture of the left femoral head neck junction with mild apex anterior angulation. 2. Mild osteoarthritis of the left hip. Electronically Signed   By: Harrietta Sherry M.D.   On: 12/30/2024 08:48   DG FEMUR PORT MIN 2 VIEWS LEFT Result Date: 12/30/2024 CLINICAL DATA:  Left femoral neck fracture. EXAM: LEFT FEMUR PORTABLE 2 VIEWS COMPARISON:  None Available. FINDINGS: Two view exam of the left femur obtained portably again shows left femoral neck fracture. This is better evaluated on left hip CT performed today. No other left femur fracture evident. Degenerative changes are noted at  the knee. IMPRESSION: Left femoral neck fracture. No other left femur fracture evident. Electronically Signed   By: Camellia Candle M.D.   On: 12/30/2024 08:20   CT Cervical Spine Wo Contrast Result Date: 12/30/2024 EXAM: CT CERVICAL SPINE WITHOUT CONTRAST 12/30/2024 01:31:10 AM TECHNIQUE: CT of the cervical spine was performed without the administration of intravenous contrast. Multiplanar reformatted images are provided for review. Automated exposure control, iterative reconstruction, and/or weight based adjustment of the mA/kV was utilized to reduce the radiation dose to as low as reasonably achievable. COMPARISON: None available. CLINICAL HISTORY: Neck trauma (Age >= 65y) FINDINGS: BONES AND ALIGNMENT: No acute fracture or traumatic malalignment.  Solid C5-C7 ACDF. DEGENERATIVE CHANGES: Right C6-C7 foraminal narrowing due to endplate spurring and facet/uncovertebral hypertrophy. SOFT TISSUES: No prevertebral soft tissue swelling. IMPRESSION: 1. No acute abnormality. 2. Solid C5-C7 ACDF. Electronically signed by: Glendia Molt MD 12/30/2024 01:42 AM EST RP Workstation: [id number]   DG Hip Unilat W or Wo Pelvis 2-3 Views Right Result Date: 12/30/2024 EXAM: 2 or 3 VIEW(S) XRAY OF THE RIGHT HIP 12/30/2024 01:16:17 AM COMPARISON: 12/30/2024 CLINICAL HISTORY: Patient  fell. FINDINGS: BONES AND JOINTS: Right hip arthroplasty in place. Intact orthopedic hardware without periprosthetic fracture or lucency. SOFT TISSUES: Unremarkable. IMPRESSION: 1. No acute fracture or dislocation. Electronically signed by: Pinkie Pebbles MD 12/30/2024 01:27 AM EST RP Workstation: [id number]   DG Chest 1 View Result Date: 12/30/2024 EXAM: 1 VIEW XRAY OF THE CHEST 12/30/2024 12:51:08 AM COMPARISON: 03/14/2020 CLINICAL HISTORY: pre-op respiratory exam FINDINGS: LUNGS AND PLEURA: No focal pulmonary opacity. No pleural effusion. No pneumothorax. HEART AND MEDIASTINUM: Aortic atherosclerosis. No acute abnormality of the cardiac and mediastinal silhouettes. BONES AND SOFT TISSUES: No acute osseous abnormality. IMPRESSION: 1. No acute process. Electronically signed by: Franky Crease MD 12/30/2024 12:57 AM EST RP Workstation: [id number]   DG Hip Unilat With Pelvis 2-3 Views Left Result Date: 12/30/2024 EXAM: 2 or 3 VIEW(S) XRAY OF THE LEFT HIP 12/30/2024 12:49:18 AM COMPARISON: 05/23/2024. CLINICAL HISTORY: Fall. Patient fell. FINDINGS: LIMITATIONS: Study limited by body habitus. BONES AND JOINTS: Prior right hip replacement. There appears to be a left femoral neck fracture with mild impaction. No malalignment. SOFT TISSUES: Unremarkable. IMPRESSION: 1. Left femoral neck fracture with mild impaction. Electronically signed by: Franky Crease MD 12/30/2024 12:55 AM EST RP Workstation: [id number]   DG Elbow Complete Left Result Date: 12/30/2024 EXAM: 3 VIEW(S) XRAY OF THE LEFT ELBOW COMPARISON: None available. CLINICAL HISTORY: Fall Fall FINDINGS: BONES AND JOINTS: No acute fracture. No malalignment. SOFT TISSUES: Unremarkable. IMPRESSION: 1. No significant abnormality. Electronically signed by: Franky Crease MD 12/30/2024 12:52 AM EST RP Workstation: [id number]    Review of Systems  HENT:  Negative for ear discharge, ear pain, hearing loss and tinnitus.   Eyes:  Negative for photophobia and pain.   Respiratory:  Negative for cough and shortness of breath.   Cardiovascular:  Negative for chest pain.  Gastrointestinal:  Negative for abdominal pain, nausea and vomiting.  Genitourinary:  Negative for dysuria, flank pain, frequency and urgency.  Musculoskeletal:  Positive for arthralgias (Left hip > right hip). Negative for back pain, myalgias and neck pain.  Neurological:  Negative for dizziness and headaches.  Hematological:  Does not bruise/bleed easily.  Psychiatric/Behavioral:  The patient is not nervous/anxious.    Blood pressure (!) 141/54, pulse 72, temperature 98.2 F (36.8 C), temperature source Oral, resp. rate 16, height 5' 2.5 (1.588 m), weight 99.3 kg, SpO2 92%. Physical Exam Constitutional:  General: She is not in acute distress.    Appearance: She is well-developed. She is not diaphoretic.  HENT:     Head: Normocephalic and atraumatic.  Eyes:     General: No scleral icterus.       Right eye: No discharge.        Left eye: No discharge.     Conjunctiva/sclera: Conjunctivae normal.  Cardiovascular:     Rate and Rhythm: Normal rate and regular rhythm.  Pulmonary:     Effort: Pulmonary effort is normal. No respiratory distress.  Musculoskeletal:     Cervical back: Normal range of motion.     Comments: LLE No traumatic wounds, ecchymosis, or rash  Mod TTP hip  No knee or ankle effusion  Knee stable to varus/ valgus and anterior/posterior stress  Sens DPN, SPN, TN intact  Motor EHL, ext, flex, evers 5/5  DP 1+, PT 1+, No significant edema  Skin:    General: Skin is warm and dry.  Neurological:     Mental Status: She is alert.  Psychiatric:        Mood and Affect: Mood normal.        Behavior: Behavior normal.     Assessment/Plan: Left hip fx -- Plan THA today. Detailed surgical plan discussed including r/b/a.  Husband and son at bedside.  All questions answered.      [1]  Allergies Allergen Reactions   Methylprednisolone Other (See Comments)     Causes hot flashes    Darvon [Propoxyphene] Nausea And Vomiting   Liraglutide Nausea Only   Lorcaserin Other (See Comments)    HEADACHE   Naltrexone-Bupropion Hcl Er Other (See Comments)    UNABLE TO SWALLOW TABLETS

## 2024-12-30 NOTE — Progress Notes (Signed)
.. ° ° °  PROCEDURAL EXPEDITER PROGRESS NOTE  Patient Name: Natalie Sanders  DOB:12-Jun-1951 Date of Admission: 12/29/2024  Date of Assessment:12/30/24   -------------------------------------------------------------------------------------------------------------------   Brief clinical summary: Pt to OR today for L total hip anterior approach arthoplasty   Orders in place:  No   Communication with surgical team if no orders: Surgeon will place orders for surgery once formal consult has been done.   Labs, test, and orders reviewed: Y  Requires surgical clearance: p No  Barriers noted: Pt needs surgical PCR done   Intervention provided by [facility] team: Order placed for PCR  Barrier resolved:  yes   -------------------------------------------------------------------------------------------------------------------  [employer], [place], [place] Please contact us  directly via secure chat (search for [facility]) or by calling us  at [phone] [facility]).

## 2024-12-30 NOTE — Transfer of Care (Signed)
 Immediate Anesthesia Transfer of Care Note  Patient: Natalie Sanders  Procedure(s) Performed: LEFT TOTAL HIP ARTHROPLASTY (Left: Hip)  Patient Location: PACU  Anesthesia Type:MAC and Spinal  Level of Consciousness: drowsy, patient cooperative, and responds to stimulation  Airway & Oxygen Therapy: Patient Spontanous Breathing and Patient connected to face mask oxygen  Post-op Assessment: Report given to RN and Post -op Vital signs reviewed and stable  Post vital signs: Reviewed and stable  Last Vitals:  Vitals Value Taken Time  BP 116/79 12/30/24 15:07  Temp    Pulse 81 12/30/24 15:09  Resp 9 12/30/24 15:09  SpO2 94 % 12/30/24 15:09  Vitals shown include unfiled device data.  Last Pain:  Vitals:   12/30/24 1220  TempSrc:   PainSc: 10-Worst pain ever         Complications: No notable events documented.

## 2024-12-30 NOTE — ED Provider Notes (Signed)
 " [place] EMERGENCY DEPARTMENT AT [facility] Provider Note   CSN: [id number] Arrival date & time: 12/29/24  2342     Patient presents with: Natalie Sanders is a 74 y.o. female.   The history is provided by the patient.  Fall   She has history of hyperlipidemia, hypothyroidism, GERD and comes in following a trip and fall.  She injured her left hip, left elbow, right lower leg.  She denies head injury or loss of consciousness.  She is not on any anticoagulants.    Prior to Admission medications  Medication Sig Start Date End Date Taking? Authorizing Provider  predniSONE  (DELTASONE ) 50 MG tablet Take 1 tablet daily for the next 5 days. 10/12/24   Vernetta Lonni GRADE, MD  acetaminophen  (TYLENOL ) 500 MG tablet Take 1,000 mg by mouth 2 (two) times daily as needed for moderate pain.    [provider]  BIMZELX 160 MG/ML pen Inject 320 mg into the skin every 8 (eight) weeks. 11/19/22 04/25/25  [provider]  cephALEXin  (KEFLEX ) 250 MG capsule Take 250 mg by mouth daily.    [provider]  cephALEXin  (KEFLEX ) 500 MG capsule Take 1 capsule (500 mg total) by mouth 4 (four) times daily. Patient not taking: Reported on 07/22/2024 03/14/20   Zackowski, Scott, MD  fluconazole  (DIFLUCAN ) 150 MG tablet Take 150 mg by mouth daily as needed (for yeast due to Bimzelx). 07/05/24   [provider]  HYDROcodone -acetaminophen  (NORCO/VICODIN) 5-325 MG tablet Take 1 tablet by mouth every 6 (six) hours as needed for moderate pain (pain score 4-6). 09/12/24   Vernetta Lonni GRADE, MD  levothyroxine  (SYNTHROID ) 75 MCG tablet Take 75 mcg by mouth daily before breakfast.     [provider]  Multiple Vitamins-Minerals (PRESERVISION AREDS 2) CAPS Take 1 capsule by mouth in the morning and at bedtime.    [provider]  ondansetron  (ZOFRAN ) 4 MG tablet Take 1 tablet (4 mg total) by mouth every 8 (eight) hours as needed for nausea or  vomiting. Patient not taking: Reported on 07/22/2024 06/15/18   Josefina Chew, MD  pantoprazole  (PROTONIX ) 40 MG tablet Take 40 mg by mouth every evening.  02/13/17   [provider]  sennosides-docusate sodium  (SENOKOT-S) 8.6-50 MG tablet Take 2 tablets by mouth daily. Patient not taking: Reported on 07/22/2024 06/15/18   Josefina Chew, MD  simvastatin  (ZOCOR ) 40 MG tablet Take 40 mg by mouth at bedtime.  03/10/17   [provider]  Soft Lens Products (OPTI-FREE REWETTING DROPS) SOLN Place 1 drop into both eyes daily as needed (dry eyes).    [provider]  triamcinolone ointment (KENALOG) 0.1 % Apply 1 Application topically daily as needed (itching). 04/28/22   [provider]  Vitamin D , Ergocalciferol , (DRISDOL) 1.25 MG (50000 UNIT) CAPS capsule Take 50,000 Units by mouth every Saturday. 07/03/23   [provider]  XARELTO  20 MG TABS tablet Take 20 mg by mouth daily.    [provider]    Allergies: Methylprednisolone, Darvon [propoxyphene], Liraglutide, Lorcaserin, and Naltrexone-bupropion hcl er    Review of Systems  All other systems reviewed and are negative.   Updated Vital Signs BP (!) 106/52 (BP Location: Right Arm)   Pulse 70   Temp 98.4 F (36.9 C) (Oral)   Resp 17   Ht 5' 2.5 (1.588 m)   Wt 99.3 kg   SpO2 95%   BMI 39.42 kg/m   Physical Exam Vitals and  nursing note reviewed.   74 year old female, resting comfortably and in no acute distress. Vital signs are normal. Oxygen saturation is 95%, which is normal. Head is normocephalic and atraumatic. PERRLA, EOMI.  Neck is mildly tender in the midline without point tenderness. Back is nontender. Lungs are clear without rales, wheezes, or rhonchi. Chest is nontender. Heart has regular rate and rhythm without murmur. Abdomen is soft, flat, nontender. Extremities: Left leg is slightly shortened and externally rotated.  There is marked pain with any passive range of motion of  the left hip and there is tenderness over the left hip.  There is vague tenderness to in the right hip with marked tenderness over the right pubic ramus.  There is a skin tear on the right lower leg and a laceration over the olecranon area of the left elbow.  Full passive range of motion is present in all joints except as noted above. Skin is warm and dry without rash. Neurologic: Awake and alert.  (all labs ordered are listed, but only abnormal results are displayed) Labs Reviewed  BASIC METABOLIC PANEL WITH GFR - Abnormal; Notable for the following components:      Result Value   Glucose, Bld 121 (*)    All other components within normal limits  CBC WITH DIFFERENTIAL/PLATELET - Abnormal; Notable for the following components:   WBC 11.1 (*)    HCT 46.4 (*)    Neutro Abs 8.8 (*)    All other components within normal limits  PROTIME-INR  TYPE AND SCREEN    EKG: EKG Interpretation Date/Time:  Friday December 30 2024 00:38:23 EST Ventricular Rate:  70 PR Interval:  149 QRS Duration:  88 QT Interval:  377 QTC Calculation: 407 R Axis:   -7  Text Interpretation: Sinus rhythm Low voltage, precordial leads Otherwise within normal limits No old tracing to compare Confirmed by Raford Lenis ([id number]) on 12/30/2024 1:37:39 AM  Radiology: CT Cervical Spine Wo Contrast Result Date: 12/30/2024 EXAM: CT CERVICAL SPINE WITHOUT CONTRAST 12/30/2024 01:31:10 AM TECHNIQUE: CT of the cervical spine was performed without the administration of intravenous contrast. Multiplanar reformatted images are provided for review. Automated exposure control, iterative reconstruction, and/or weight based adjustment of the mA/kV was utilized to reduce the radiation dose to as low as reasonably achievable. COMPARISON: None available. CLINICAL HISTORY: Neck trauma (Age >= 65y) FINDINGS: BONES AND ALIGNMENT: No acute fracture or traumatic malalignment.  Solid C5-C7 ACDF. DEGENERATIVE CHANGES: Right C6-C7 foraminal narrowing  due to endplate spurring and facet/uncovertebral hypertrophy. SOFT TISSUES: No prevertebral soft tissue swelling. IMPRESSION: 1. No acute abnormality. 2. Solid C5-C7 ACDF. Electronically signed by: Glendia Molt MD 12/30/2024 01:42 AM EST RP Workstation: [id number]   DG Hip Unilat W or Wo Pelvis 2-3 Views Right Result Date: 12/30/2024 EXAM: 2 or 3 VIEW(S) XRAY OF THE RIGHT HIP 12/30/2024 01:16:17 AM COMPARISON: 12/30/2024 CLINICAL HISTORY: Patient fell. FINDINGS: BONES AND JOINTS: Right hip arthroplasty in place. Intact orthopedic hardware without periprosthetic fracture or lucency. SOFT TISSUES: Unremarkable. IMPRESSION: 1. No acute fracture or dislocation. Electronically signed by: Pinkie Pebbles MD 12/30/2024 01:27 AM EST RP Workstation: [id number]   DG Chest 1 View Result Date: 12/30/2024 EXAM: 1 VIEW XRAY OF THE CHEST 12/30/2024 12:51:08 AM COMPARISON: 03/14/2020 CLINICAL HISTORY: pre-op respiratory exam FINDINGS: LUNGS AND PLEURA: No focal pulmonary opacity. No pleural effusion. No pneumothorax. HEART AND MEDIASTINUM: Aortic atherosclerosis. No acute abnormality of the cardiac and mediastinal silhouettes. BONES AND SOFT TISSUES: No acute osseous abnormality. IMPRESSION: 1. No acute  process. Electronically signed by: Franky Crease MD 12/30/2024 12:57 AM EST RP Workstation: [id number]   DG Hip Unilat With Pelvis 2-3 Views Left Result Date: 12/30/2024 EXAM: 2 or 3 VIEW(S) XRAY OF THE LEFT HIP 12/30/2024 12:49:18 AM COMPARISON: 05/23/2024. CLINICAL HISTORY: Fall. Patient fell. FINDINGS: LIMITATIONS: Study limited by body habitus. BONES AND JOINTS: Prior right hip replacement. There appears to be a left femoral neck fracture with mild impaction. No malalignment. SOFT TISSUES: Unremarkable. IMPRESSION: 1. Left femoral neck fracture with mild impaction. Electronically signed by: Franky Crease MD 12/30/2024 12:55 AM EST RP Workstation: [id number]   DG Elbow Complete Left Result Date: 12/30/2024 EXAM: 3 VIEW(S)  XRAY OF THE LEFT ELBOW COMPARISON: None available. CLINICAL HISTORY: Fall Fall FINDINGS: BONES AND JOINTS: No acute fracture. No malalignment. SOFT TISSUES: Unremarkable. IMPRESSION: 1. No significant abnormality. Electronically signed by: Franky Crease MD 12/30/2024 12:52 AM EST RP Workstation: [id number]     Procedures   Medications Ordered in the ED  morphine  (PF) 4 MG/ML injection 4 mg (4 mg Intravenous Given 12/30/24 0159)  ondansetron  (ZOFRAN ) injection 4 mg (has no administration in time range)  pantoprazole  (PROTONIX ) injection 40 mg (40 mg Intravenous Given 12/30/24 0113)                                    Medical Decision Making Amount and/or Complexity of Data Reviewed Labs: ordered. Radiology: ordered.  Risk Prescription drug management. Decision regarding hospitalization.   Fall with injury to left hip, laceration of left elbow, skin tear right lower leg.  Because of tenderness to cervical spine, I have ordered CT of cervical spine as well as x-rays of both hips, and left elbow.  CT scan shows no acute injury, prior cervical fusion in place.  Elbow x-ray shows no fracture, hip x-rays do show a left femoral neck fracture, prosthetic right hip with no abnormalities.  Have independently viewed all the images, and agree with radiologist interpretation.  I have reviewed her laboratory tests, my interpretation is mildly elevated random glucose level which will need to be followed as an outpatient, mild leukocytosis which is nonspecific, otherwise normal CBC and metabolic panel.  I have discussed the case with Dr. Harden of orthopedic service who agrees to see the patient in consultation, I have discussed case with Dr. Charlton of [facility], who agrees to admit the patient., who agrees to admit the patient.     Final diagnoses:  Closed fracture of neck of left femur, initial encounter (HCC)  Laceration of left elbow, initial encounter  Skin tear of right lower leg without  complication, initial encounter  Elevated random blood glucose level    ED Discharge Orders     None          Raford Lenis, MD 12/30/24 [phone]  "

## 2024-12-30 NOTE — H&P (Signed)

## 2024-12-30 NOTE — Anesthesia Preprocedure Evaluation (Signed)
 "                                  Anesthesia Evaluation  Patient identified by MRN, date of birth, ID band Patient awake    Reviewed: Allergy & Precautions, NPO status , Patient's Chart, lab work & pertinent test results  History of Anesthesia Complications (+) history of anesthetic complications  Airway Mallampati: II  TM Distance: >3 FB     Dental no notable dental hx. (+) Teeth Intact, Dental Advisory Given   Pulmonary neg pulmonary ROS, former smoker   Pulmonary exam normal breath sounds clear to auscultation       Cardiovascular negative cardio ROS Normal cardiovascular exam Rhythm:Regular Rate:Normal     Neuro/Psych negative neurological ROS  negative psych ROS   GI/Hepatic Neg liver ROS, hiatal hernia,GERD  Medicated,,  Endo/Other  Hypothyroidism  Obesity HLD  Renal/GU negative Renal ROSLab Results      Component                Value               Date                      NA                       139                 12/30/2024                CL                       103                 12/30/2024                K                        3.9                 12/30/2024                CO2                      25                  12/30/2024                BUN                      15                  12/30/2024                CREATININE               0.92                12/30/2024                GFRNONAA                 >60                 12/30/2024  CALCIUM                  9.0                 12/30/2024                GLUCOSE                  121 (H)             12/30/2024             negative genitourinary   Musculoskeletal  (+) Arthritis , Osteoarthritis,  Left femoral neck Fx   Abdominal  (+) + obese  Peds  Hematology Lab Results      Component                Value               Date                      WBC                      11.1 (H)            12/30/2024                HGB                      14.7                 12/30/2024                HCT                      46.4 (H)            12/30/2024                MCV                      92.6                12/30/2024                PLT                      164                 12/30/2024              Anesthesia Other Findings   Reproductive/Obstetrics                              Anesthesia Physical Anesthesia Plan  ASA: 3  Anesthesia Plan: Spinal   Post-op Pain Management: Minimal or no pain anticipated   Induction: Intravenous  PONV Risk Score and Plan: 3 and Treatment may vary due to age or medical condition and Propofol  infusion  Airway Management Planned: Natural Airway and Simple Face Mask  Additional Equipment: None  Intra-op Plan:   Post-operative Plan:   Informed Consent: I have reviewed the patients History and Physical, chart, labs and discussed the procedure including the risks, benefits and alternatives for the proposed anesthesia with the patient or authorized representative who has indicated his/her understanding and acceptance.     Dental advisory given  Plan Discussed with: Anesthesiologist  Anesthesia Plan Comments:          Anesthesia Quick Evaluation  "

## 2024-12-30 NOTE — H&P (Addendum)
 " Virtual History and Physical    Referring Provider: Alm Lias MD Telemedicine Provider: Donalda Applebaum MD Provider Location: [facility] Patient Location: [facility] ED[facility] Referring Diagnosis: Left hip fracture Patient Name and DOB verified: yes Patient consented to Telemedicine Evaluation:yes RN virtual assistant: Clorinda Patient RN Video encounter time and date:12/30/24 at 8:58 am  Patient: Natalie Sanders FMW:[id number] DOB: [birth year] PCP: Andrew Truman GRADE., MD   Chief Complaint:  Chief Complaint  Patient presents with   Fall   HPI: Natalie Sanders is a 74 y.o. 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  Per patient-she she was out taking her dog 1 final time before she went to bed-she was in her bathroom slippers-and thinks she lost her balance and fell in the driveway.  She immediately started having severe pain in her left lower extremity-she was unable to get up.  She denies that she lost consciousness-and thinks that she just lost her balance.  She managed to call her family-subsequently EMS was called-and she was subsequently brought to the ED where she was found to have left femoral neck fracture.  Per patient-she has been in her usual state of health-she recently had right hip replacement in August 2025 by Dr. Gil has been doing fairly well.  She has been fairly ambulatory-claims she has no issues with chest pain or shortness of breath when she does go grocery shopping-is easily able to walk up and down the aisle.  She also claims that she can easily walk up a flight of stairs without any major issues.  She has not had any prior MI.  No recent fever No recent headache No shortness of breath No nausea, vomiting or diarrhea No abdominal pain No hematochezia/melena/hematemesis    Review of Systems: As mentioned  in the history of present illness. All other systems reviewed and are negative. 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   Frequent UTI    takes keflex  daily   Full dentures    GERD (gastroesophageal reflux disease)    High cholesterol    History of hiatal hernia    Hypothyroidism    Primary localized osteoarthritis of right knee 06/15/2018   Past Surgical History:  Procedure Laterality Date   ABDOMINAL HYSTERECTOMY     partial   ANKLE RECONSTRUCTION Left 02/23/2014   Procedure: LEFT FIFTH BASE EXOSTOSIS/PERONEUS BREVIS REPAIR;  Surgeon: Norleen Armor, MD;  Location: [facility];  Service: Orthopedics;  Laterality: Left;   ANTERIOR CERVICAL DECOMP/DISCECTOMY FUSION N/A 04/16/2017   Procedure: ANTERIOR CERVICAL DECOMPRESSION/FUSION CERVICAL FIVE-SIX, CERVICAL SIX-SEVEN;  Surgeon: Louis Shove, MD;  Location: [facility] OR;  Service: Neurosurgery;  Laterality: N/A;  right side approach   APPENDECTOMY     CHOLECYSTECTOMY  2005   ESOPHAGOGASTRODUODENOSCOPY ENDOSCOPY  03/2017   IR ESOPHAGUS DILITATION RETRO FLUORO     TOTAL HIP ARTHROPLASTY Right 08/02/2024   Procedure: ARTHROPLASTY, HIP, TOTAL, ANTERIOR APPROACH;  Surgeon: Vernetta Lonni GRADE, MD;  Location: [facility] OR;  Service: Orthopedics;  Laterality: Right;   TOTAL KNEE ARTHROPLASTY Right 06/15/2018   Procedure: RIGHT TOTAL KNEE ARTHROPLASTY;  Surgeon: Josefina Chew, MD;  Location: [facility];  Service: Orthopedics;  Laterality: Right;   Social History:  reports that she quit smoking about 18 years ago. Her smoking use included cigarettes. She has never used smokeless tobacco. She reports that she does not drink alcohol and does not use drugs.  Allergies[1]  History reviewed. No pertinent family  history.  Prior to Admission medications  Medication Sig Start Date End Date Taking? Authorizing Provider  predniSONE  (DELTASONE ) 50 MG tablet Take 1 tablet daily for the next 5 days. 10/12/24   Vernetta Lonni GRADE, MD  acetaminophen  (TYLENOL ) 500 MG tablet Take 1,000 mg by mouth 2 (two) times daily as needed for moderate pain.    [provider]  BIMZELX 160 MG/ML pen Inject 320 mg into the skin every 8 (eight) weeks. 11/19/22 04/25/25  [provider]  cephALEXin  (KEFLEX ) 250 MG capsule Take 250 mg by mouth daily.    [provider]  cephALEXin  (KEFLEX ) 500 MG capsule Take 1 capsule (500 mg total) by mouth 4 (four) times daily. Patient not taking: Reported on 07/22/2024 03/14/20   Zackowski, Scott, MD  fluconazole  (DIFLUCAN ) 150 MG tablet Take 150 mg by mouth daily as needed (for yeast due to Bimzelx). 07/05/24   [provider]  HYDROcodone -acetaminophen  (NORCO/VICODIN) 5-325 MG tablet Take 1 tablet by mouth every 6 (six) hours as needed for moderate pain (pain score 4-6). 09/12/24   Vernetta Lonni GRADE, MD  levothyroxine  (SYNTHROID ) 75 MCG tablet Take 75 mcg by mouth daily before breakfast.     [provider]  Multiple Vitamins-Minerals (PRESERVISION AREDS 2) CAPS Take 1 capsule by mouth in the morning and at bedtime.    [provider]  ondansetron  (ZOFRAN ) 4 MG tablet Take 1 tablet (4 mg total) by mouth every 8 (eight) hours as needed for nausea or vomiting. Patient not taking: Reported on 07/22/2024 06/15/18   Josefina Chew, MD  pantoprazole  (PROTONIX ) 40 MG tablet Take 40 mg by mouth every evening.  02/13/17   [provider]  sennosides-docusate sodium  (SENOKOT-S) 8.6-50 MG tablet Take 2 tablets by mouth daily. Patient not taking: Reported on 07/22/2024 06/15/18   Josefina Chew, MD  simvastatin  (ZOCOR ) 40 MG tablet Take 40 mg by mouth at bedtime.  03/10/17   [provider]  Soft Lens Products (OPTI-FREE REWETTING DROPS)  SOLN Place 1 drop into both eyes daily as needed (dry eyes).    [provider]  triamcinolone ointment (KENALOG) 0.1 % Apply 1 Application topically daily as needed (itching). 04/28/22   [provider]  Vitamin D , Ergocalciferol , (DRISDOL) 1.25 MG (50000 UNIT) CAPS capsule Take 50,000 Units by mouth every Saturday. 07/03/23   [provider]  XARELTO  20 MG TABS tablet Take 20 mg by mouth daily.    [provider]    Physical Exam: Vitals:   12/30/24 0124 12/30/24 0200 12/30/24 0300 12/30/24 0615  BP: (!) 106/52 (!) 122/57 (!) 142/60 (!) 141/54  Pulse: 70 75 84 72  Resp: 17 15 17 16   Temp: 98.4 F (36.9 C)   98.2 F (36.8 C)  TempSrc: Oral   Oral  SpO2: 95% 93% 92% 92%  Weight:      Height:       Gen Exam:Alert awake-not in any distress HEENT:atraumatic, normocephalic Chest: B/L clear to auscultation anteriorly CVS:S1S2 regular Abdomen:soft non tender, non distended Extremities:no edema Neurology: Has obvious pain when attempting to move LLE but easily able to wiggle her toes.  Pain also limits of movement of RLE-but able to wiggle  her toes. Skin: no rash   Data Reviewed:     Latest Ref Rng & Units 12/30/2024    1:01 AM 08/03/2024    5:05 AM 07/25/2024    2:30 PM  CBC  WBC 4.0 - 10.5 K/uL 11.1  13.4  8.1   Hemoglobin 12.0 - 15.0 g/dL 85.2  86.8  84.6   Hematocrit 36.0 - 46.0 % 46.4  38.7  47.4   Platelets 150 - 400 K/uL 164  189  212     BMET    Component Value Date/Time   NA 139 12/30/2024 0101   K 3.9 12/30/2024 0101   CL 103 12/30/2024 0101   CO2 25 12/30/2024 0101   GLUCOSE 121 (H) 12/30/2024 0101   BUN 15 12/30/2024 0101   CREATININE 0.92 12/30/2024 0101   CALCIUM 9.0 12/30/2024 0101   GFRNONAA >60 12/30/2024 0101     X-ray left elbow: No fracture X-ray left hip: Left femoral neck fracture with mild impaction Chest x-ray: No acute process X-ray right hip: No fracture CT C-spine: No acute abnormality  Twelve-lead EKG:  Normal sinus rhythm-but low voltage.   Assessment and Plan: Left femoral neck fracture Following a mechanical fall Await formal orthopedic evaluation She recently tolerated right hip replacement in August 2025 pretty well-she is medically stable to proceed with planned surgery without any further workup.  Right hip pain Complains of pain in the right hip and is not able to freely move the hip following her fall last evening X-ray of the right hip does not show any fractures. I spoke with Ortho MD Dr.Xu-who will evaluate patient shortly and see if patient needs further imaging.  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  Recurrent UTI On chronic suppressive Keflex  which will be resumed.    Advance Care Planning:   Code Status: Full Code   Consults: Orthopedics  Family Communication: None at bedside  Severity of Illness: The appropriate patient status for this patient is INPATIENT. Inpatient status is judged to be reasonable and necessary in order to provide the required intensity of service to ensure the patient's safety. The patient's presenting symptoms, physical exam findings, and initial radiographic and laboratory data in the context of their chronic comorbidities is felt to place them at high risk for further clinical deterioration. Furthermore, it is not anticipated that the patient will be medically stable for discharge from the hospital within 2 midnights of admission.   * I certify that at the point of admission it is my clinical judgment that the patient will require inpatient hospital care spanning beyond 2 midnights from the point of admission due to high intensity of service, high risk for further deterioration and high frequency of surveillance  required.*  Author: Donalda Applebaum, MD 12/30/2024 9:29 AM  For on call review [email or web address].      [1]  Allergies Allergen Reactions   Methylprednisolone Other (See Comments)    Causes hot flashes    Darvon [Propoxyphene] Nausea And Vomiting   Liraglutide Nausea Only   Lorcaserin Other (See Comments)    HEADACHE   Naltrexone-Bupropion Hcl Er Other (See Comments)    UNABLE TO SWALLOW TABLETS   "

## 2024-12-30 NOTE — ED Notes (Signed)
 Patient transported to CT

## 2024-12-31 ENCOUNTER — Inpatient Hospital Stay (HOSPITAL_COMMUNITY)

## 2024-12-31 DIAGNOSIS — S72002A Fracture of unspecified part of neck of left femur, initial encounter for closed fracture: Secondary | ICD-10-CM | POA: Diagnosis not present

## 2024-12-31 MED ORDER — HEPARIN (PORCINE) 25000 UT/250ML-% IV SOLN
1200.0000 [IU]/h | INTRAVENOUS | Status: DC
  Administered 2024-12-31: 1200 [IU]/h via INTRAVENOUS
  Filled 2024-12-31: qty 250

## 2024-12-31 MED ORDER — IOHEXOL 350 MG/ML SOLN
75.0000 mL | Freq: Once | INTRAVENOUS | Status: AC | PRN
  Administered 2024-12-31: 75 mL via INTRAVENOUS

## 2024-12-31 MED ORDER — IPRATROPIUM-ALBUTEROL 0.5-2.5 (3) MG/3ML IN SOLN
3.0000 mL | Freq: Four times a day (QID) | RESPIRATORY_TRACT | Status: DC
  Administered 2024-12-31 – 2025-01-01 (×2): 3 mL via RESPIRATORY_TRACT
  Filled 2024-12-31 (×2): qty 3

## 2024-12-31 MED ORDER — HEPARIN BOLUS VIA INFUSION
2000.0000 [IU] | Freq: Once | INTRAVENOUS | Status: AC
  Administered 2024-12-31: 2000 [IU] via INTRAVENOUS
  Filled 2024-12-31: qty 2000

## 2024-12-31 MED ORDER — HYDROCODONE-ACETAMINOPHEN 5-325 MG PO TABS
1.0000 | ORAL_TABLET | Freq: Four times a day (QID) | ORAL | Status: DC | PRN
  Administered 2024-12-31 – 2025-01-01 (×3): 2 via ORAL
  Filled 2024-12-31 (×3): qty 2

## 2024-12-31 NOTE — Op Note (Signed)
 "  LEFT TOTAL HIP ARTHROPLASTY  Procedure Note SHAMONIQUE BATTISTE   [id number]  Pre-op Diagnosis: left femoral neck fracture     Post-op Diagnosis: same  Operative Findings Acute transcervical displaced femoral neck fracture   Operative Procedures  1. Total hip replacement; Left hip; uncemented [id number]   Surgeon: Kay Cummins, M.D.  Assist: Joey dixon   Anesthesia: spinal  Prosthesis: Depuy Acetabulum: Pinnacle 52 mm Femur: Actis 6 HO Head: 36 mm size: +1.5 Liner: +4 neutral Bearing Type: ceramic/poly  Total Hip Arthroplasty (Anterior Approach) Op Note:  After informed consent was obtained and the operative extremity marked in the holding area, the patient was brought back to the operating room and placed supine on the HANA table. Next, the operative extremity was prepped and draped in normal sterile fashion. Surgical timeout occurred verifying patient identification, surgical site, surgical procedure and administration of antibiotics.  A 10 cm longitudinal incision was made starting from 3 fingerbreadths lateral and 1 inferior to the ASIS towards the lateral aspect of the patella.  A Hueter approach to the hip was performed, using the interval between tensor fascia lata and sartorius.  Dissection was carried bluntly down onto the anterior hip capsule. The lateral femoral circumflex vessels were identified and coagulated. A capsulotomy was performed and the capsular flaps tagged for later repair.  Fracture hematoma was evacuated.  The fracture complete and significantly angulated.  The neck osteotomy was performed 1 fingerbreadth above the lesser trochanter. The femoral head was removed, the acetabular rim was cleared of soft tissue and osteophytes and attention was turned to reaming the acetabulum.  Sequential reaming was performed under fluoroscopic guidance down to the floor of the cotyloid fossa. We reamed to a size 51 mm, and then impacted the acetabular shell. A 25 mm cancellous  screw was placed to secure the shell.  A +4 neutral liner was then placed after irrigation and attention turned to the femur.  After placing the femoral hook, the leg was taken to externally rotated, extended and adducted position taking care to perform soft tissue releases to allow for adequate mobilization of the femur. Soft tissue was cleared from the shoulder of the greater trochanter and the hook elevator used to improve exposure of the proximal femur.  Lateral bone from the shoulder was rasped away for relief.  Sequential broaching performed up to a size 6.  High offset trial neck and +1.5 head were placed. The leg was brought back up to neutral and the construct reduced.  The position and sizing of components, offset and leg lengths were checked using fluoroscopy.  Stability of the construct was checked in 45 degrees of hip extension and 90 degrees of external rotation without any subluxation, shuck or impingement of prosthesis. We dislocated the prosthesis, dropped the leg back into position, removed trial components, and irrigated copiously. The final stem and head were chosen then placed, the leg brought back up, the system reduced and fluoroscopy used to verify positioning.  Antibiotic irrigation was placed in the surgical wound.   We irrigated, obtained hemostasis and closed the capsule using #2 ethibond suture.  A topical mixture of 0.25% bupivacaine  and meloxicam  was placed deep to the fascia.  One gram of vancomycin  powder was placed in the surgical bed.   One gram of topical tranexamic acid  was injected into the joint.  The fascia was closed with #1 stratafix, the deep fat layer was closed with 0 vicryl, the subcutaneous layers closed with 2.0 Vicryl Plus and the  skin closed with 2.0 nylon and dermabond. A sterile dressing was applied. The patient was awakened in the operating room and taken to recovery in stable condition.  All sponge, needle, and instrument counts were correct at the end of the  case.   Position: supine  Complications: see description of procedure.  Time Out: performed   Drains/Packing: none  Estimated blood loss: see anesthesia record  Returned to Recovery Room: in good condition.   Antibiotics: yes   Mechanical VTE (DVT) Prophylaxis: sequential compression devices, TED thigh-high  Chemical VTE (DVT) Prophylaxis: lovenox    Fluid Replacement: see anesthesia record  Specimens Removed: 1 to pathology   Sponge and Instrument Count Correct? yes   PACU: portable radiograph - low AP   Plan/RTC: Return in 2 weeks for suture removal. Weight Bearing/Load Lower Extremity: full  Hip precautions: none Suture Removal: 2 weeks   N. Ozell Cummins, MD [facility] 10:04 AM   Implant Name Type Inv. Item Serial No. Manufacturer Lot No. LRB No. Used Action  PIN SECTOR W/GRIP ACE CUP - [id number] Hips PIN SECTOR W/GRIP ACE CUP  [employer] [id number] Left 1 Implanted  LINER NEUTRAL 52X36MM PLUS 4 - [id number] Liner LINER NEUTRAL 52X36MM PLUS 4  [employer] [id number] Left 1 Implanted  SCREW 6.5MMX25MM - [id number] Screw SCREW 6.5MMX25MM  [employer] [id number] Left 1 Implanted  STEM FEMORAL SZ6 HIGH ACTIS - [id number] Stem STEM FEMORAL SZ6 HIGH ACTIS  [employer] [id number] Left 1 Implanted  HEAD CERAMIC DELTA 36 PLUS 1.5 - [id number] Hips HEAD CERAMIC DELTA 36 PLUS 1.5  [employer] [id number] Left 1 Implanted   "
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
## 2024-12-31 NOTE — Evaluation (Signed)
 Occupational Therapy Evaluation Patient Details Name: Natalie Sanders MRN: [id number] DOB: Nov 24, 1951 Today's Date: 12/31/2024   History of Present Illness   Pt is a 74 y.o. F who presents with left femoral neck fx s/p THA. PMH includes R TKA, R THA, herniated cervical disc.     Clinical Impressions Pt c/o L hip pain, and trouble breathing during activity. Pt O2 sats on RA at rest ranged from 85-88% O2, drops to 70s quickly with activity on RA. With 3L O2 via [place] Pt resting at 97%, during activity 85-90%. Pt lives with husband, PLOF mod I. Currently Pt requires O2 supplementation at rest and during activity. Pt min A for bed mobility and sit to stand. Once upright, Pt ambulates with CGA, completes toileting hygiene without physical assist. Pt will need help with LB ADLs, overall has good BUE strength and ROM to complete grooming and UB dressing/bathing. Pt tolerated session well but feels out of breath frequently. MD/RN informed. Will continue to see acutely to progress as able, HHOT follow up recommended.      If plan is discharge home, recommend the following:   A little help with walking and/or transfers;A little help with bathing/dressing/bathroom;Assistance with cooking/housework;Assist for transportation;Help with stairs or ramp for entrance     Functional Status Assessment   Patient has had a recent decline in their functional status and demonstrates the ability to make significant improvements in function in a reasonable and predictable amount of time.     Equipment Recommendations   Other (comment) (currently has O2 needs)     Recommendations for Other Services         Precautions/Restrictions   Precautions Precautions: Fall Recall of Precautions/Restrictions: Intact Restrictions Weight Bearing Restrictions Per Provider Order: No     Mobility Bed Mobility Overal bed mobility: Needs Assistance Bed Mobility: Supine to Sit, Sit to Supine     Supine to sit:  Min assist Sit to supine: Min assist   General bed mobility comments: min A in/out of bed    Transfers Overall transfer level: Needs assistance Equipment used: Rolling walker (2 wheels) Transfers: Sit to/from Stand Sit to Stand: Contact guard assist           General transfer comment: CGA for safety and line management.      Balance Overall balance assessment: Needs assistance Sitting-balance support: Feet supported Sitting balance-Leahy Scale: Good     Standing balance support: Bilateral upper extremity supported, Reliant on assistive device for balance Standing balance-Leahy Scale: Poor                             ADL either performed or assessed with clinical judgement   ADL Overall ADL's : Needs assistance/impaired Eating/Feeding: Independent   Grooming: Set up;Standing   Upper Body Bathing: Set up;Sitting   Lower Body Bathing: Moderate assistance;Sitting/lateral leans;Sit to/from stand   Upper Body Dressing : Set up;Sitting   Lower Body Dressing: Moderate assistance;Sitting/lateral leans;Sit to/from stand   Toilet Transfer: Contact guard assist;Rolling walker (2 wheels)   Toileting- Clothing Manipulation and Hygiene: Set up;Supervision/safety       Functional mobility during ADLs: Contact guard assist;Rolling walker (2 wheels) General ADL Comments: CGA for OOB activities, able to complete toileting without physical assist. Pt will need mod A for LB ADLs. Pt desats quickly with any OOB activity, needs 3L O2 via [place] minimum for OOB activities, needs 2-3L at rest     Vision  Perception         Praxis         Pertinent Vitals/Pain Pain Assessment Pain Assessment: 0-10 Pain Score: 6  Pain Location: L hip Pain Descriptors / Indicators: Operative site guarding, Discomfort, Sore Pain Intervention(s): Monitored during session     Extremity/Trunk Assessment Upper Extremity Assessment Upper Extremity Assessment: Overall WFL for  tasks assessed           Communication Communication Communication: No apparent difficulties   Cognition Arousal: Alert Behavior During Therapy: WFL for tasks assessed/performed Cognition: No apparent impairments                               Following commands: Intact       Cueing  General Comments   Cueing Techniques: Verbal cues  resting in bed on RA O2 saturation 85-88%. on 3L O2 via [place] quickly improves to 97%. Pt quickly desats to 70s with activity on RA, maintains 85-90% O2 saturation with activity on 3L O2.   Exercises     Shoulder Instructions      Home Living Family/patient expects to be discharged to:: Private residence Living Arrangements: Spouse/significant other Available Help at Discharge: Family;Available 24 hours/day Type of Home: House Home Access: Stairs to enter [employer] of Steps: 3 Entrance Stairs-Rails: Left Home Layout: One level     Bathroom Shower/Tub: Walk-in shower;Tub/shower unit   Bathroom Toilet: Handicapped height     Home Equipment: Agricultural Consultant (2 wheels);Cane - single point;Crutches;Rollator (4 wheels);BSC/3in1   Additional Comments: Pt lives with husband, available to assist as needed.      Prior Functioning/Environment Prior Level of Function : Independent/Modified Independent;Driving             Mobility Comments: ind. ADLs Comments: mod I    OT Problem List: Decreased strength;Decreased range of motion;Decreased activity tolerance;Impaired balance (sitting and/or standing);Pain;Cardiopulmonary status limiting activity   OT Treatment/Interventions: Self-care/ADL training;Therapeutic exercise;Energy conservation;DME and/or AE instruction;Therapeutic activities;Patient/family education;Balance training      OT Goals(Current goals can be found in the care plan section)   Acute Rehab OT Goals Patient Stated Goal: to improve breathing OT Goal Formulation: With patient/family Time For  Goal Achievement: 01/14/25 Potential to Achieve Goals: Good   OT Frequency:  Min 2X/week    Co-evaluation              AM-PAC OT 6 Clicks Daily Activity     Outcome Measure Help from another person eating meals?: None Help from another person taking care of personal grooming?: A Little Help from another person toileting, which includes using toliet, bedpan, or urinal?: A Little Help from another person bathing (including washing, rinsing, drying)?: A Lot Help from another person to put on and taking off regular upper body clothing?: A Little Help from another person to put on and taking off regular lower body clothing?: A Lot 6 Click Score: 17   End of Session Equipment Utilized During Treatment: Gait belt;Rolling walker (2 wheels) Nurse Communication: Mobility status  Activity Tolerance: Patient tolerated treatment well Patient left: in bed;with call bell/phone within reach;with bed alarm set;with family/visitor present  OT Visit Diagnosis: Unsteadiness on feet (R26.81);Other abnormalities of gait and mobility (R26.89);Muscle weakness (generalized) (M62.81);Pain Pain - Right/Left: Left Pain - part of body: Hip                Time: 1325-1402 OT Time Calculation (min): 37 min Charges:  OT General Charges $  OT Visit: 1 Visit OT Evaluation $OT Eval Low Complexity: 1 Low OT Treatments $Self Care/Home Management : 8-22 mins  [place], OTR/L   Natalie Sanders 12/31/2024, 2:31 PM

## 2024-12-31 NOTE — Plan of Care (Signed)
  Problem: Education: Goal: Knowledge of General Education information will improve Description: Including pain rating scale, medication(s)/side effects and non-pharmacologic comfort measures Outcome: Progressing   Problem: Clinical Measurements: Goal: Will remain free from infection Outcome: Progressing   Problem: Nutrition: Goal: Adequate nutrition will be maintained Outcome: Progressing   Problem: Coping: Goal: Level of anxiety will decrease Outcome: Progressing   Problem: Elimination: Goal: Will not experience complications related to urinary retention Outcome: Progressing   

## 2024-12-31 NOTE — Progress Notes (Signed)
 PHARMACY - ANTICOAGULATION CONSULT NOTE  Pharmacy Consult for Heparin  Indication: pulmonary embolus  Allergies[1]  Patient Measurements: Height: 5' 2.5 (158.8 cm) Weight: 98.4 kg (217 lb) IBW/kg (Calculated) : 51.25 HEPARIN  DW (KG): 74.4  Vital Signs: Temp: 98.6 F (37 C) (01/24 1505) BP: 157/52 (01/24 1505) Pulse Rate: 68 (01/24 1505)  Labs: Recent Labs    12/30/24 0101 12/30/24 2024  HGB 14.7 13.7  HCT 46.4* 41.6  PLT 164 129*  LABPROT 13.1  --   INR 0.9  --   CREATININE 0.92 0.75    Estimated Creatinine Clearance: 69.3 mL/min (by C-G formula based on SCr of 0.75 mg/dL).    Assessment: Patient presented to hospital for hip fracture. POD1 from THA. 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 CT scan positive for PE. Pharmacy consulted for heparin  infusion. Patient received DVT prophylaxis lovenox  45 mg 1/23 at 2230. Given recent surgery and lovenox  dose will do a reduced loading dose.  Goal of Therapy:  Heparin  level 0.3-0.7 units/ml Monitor platelets by anticoagulation protocol: Yes   Plan:  Give 2000 units bolus x 1 Start heparin  infusion at 1200 units/hr Check anti-Xa level in 8 hours and daily while on heparin  Continue to monitor H&H and platelets  Larraine Brazier, PharmD Clinical Pharmacist 12/31/2024  4:44 PM **Pharmacist phone directory can now be found on [email or web address] ([email or web address]).  Listed under [facility].       [1]  Allergies Allergen Reactions   Methylprednisolone Other (See Comments)    Causes hot flashes    Darvon [Propoxyphene] Nausea And Vomiting   Liraglutide Nausea Only   Lorcaserin Other (See Comments)    HEADACHE   Naltrexone-Bupropion Hcl Er Other (See Comments)    UNABLE TO SWALLOW TABLETS

## 2024-12-31 NOTE — Progress Notes (Signed)
 Patient ID: Natalie Sanders, female   DOB: 14-May-1951, 74 y.o.   MRN: [id number] Patient is postoperative day 1 total hip arthroplasty for femoral neck fracture.  Anticipate patient can discharge to home when she is safe with therapy.  Patient was concerned her pain regiment was every 12 hours.  Patient does have Vicodin scheduled for every 6 hours and morphine  every 2 hours.

## 2024-12-31 NOTE — Evaluation (Signed)
 Physical Therapy Evaluation Patient Details Name: Natalie Sanders MRN: [id number] DOB: May 18, 1951 Today's Date: 12/31/2024  History of Present Illness  Pt is a 74 y.o. F who presents with left femoral neck fx s/p THA. PMH includes R TKA, R THA, herniated cervical disc.  Clinical Impression  Pt admitted with above. PTA, pt lives with her spouse and is independent. Pt pleasant and motivated to participate in therapy session. Worked on bed level AAROM/AROM exercises to warm up. Pt requiring min assist for bed mobility. Pt ambulating 30 ft with a RW with step to pattern. SpO2 low 80's on RA, so placed back on 3L O2 with rebound to 92% (MD present and aware). Suspect excellent progress. Recommend HHPT.        If plan is discharge home, recommend the following: A little help with walking and/or transfers;A little help with bathing/dressing/bathroom;Assistance with cooking/housework;Assist for transportation;Help with stairs or ramp for entrance   Can travel by private vehicle        Equipment Recommendations None recommended by PT (pt equipped)  Recommendations for Other Services       Functional Status Assessment Patient has had a recent decline in their functional status and demonstrates the ability to make significant improvements in function in a reasonable and predictable amount of time.     Precautions / Restrictions Precautions Precautions: Fall Restrictions Weight Bearing Restrictions Per Provider Order: No      Mobility  Bed Mobility Overal bed mobility: Needs Assistance Bed Mobility: Supine to Sit     Supine to sit: Min assist     General bed mobility comments: MinA for to initiate bringing LLE off edge of bed, HOB elevated, use of bed rail. Increased time    Transfers Overall transfer level: Needs assistance Equipment used: Rolling walker (2 wheels) Transfers: Sit to/from Stand Sit to Stand: Contact guard assist                 Ambulation/Gait Ambulation/Gait assistance: Contact guard assist Gait Distance (Feet): 30 Feet Assistive device: Rolling walker (2 wheels) Gait Pattern/deviations: Step-to pattern, Decreased stance time - left, Decreased weight shift to left Gait velocity: decreased Gait velocity interpretation: <1.31 ft/sec, indicative of household ambulator   General Gait Details: Verbal cues for sequencing/technique, step to pattern  Stairs            Wheelchair Mobility     Tilt Bed    Modified Rankin (Stroke Patients Only)       Balance Overall balance assessment: Needs assistance Sitting-balance support: Feet supported Sitting balance-Leahy Scale: Good     Standing balance support: Bilateral upper extremity supported, Reliant on assistive device for balance Standing balance-Leahy Scale: Poor                               Pertinent Vitals/Pain Pain Assessment Pain Assessment: Faces Faces Pain Scale: Hurts little more Pain Location: L hip Pain Descriptors / Indicators: Operative site guarding, Discomfort, Sore Pain Intervention(s): Monitored during session    Home Living Family/patient expects to be discharged to:: Private residence Living Arrangements: Spouse/significant other Available Help at Discharge: Family;Available 24 hours/day Type of Home: House Home Access: Stairs to enter Entrance Stairs-Rails: Left Entrance Stairs-Number of Steps: 3   Home Layout: One level Home Equipment: Agricultural Consultant (2 wheels);Cane - single point;Crutches;Rollator (4 wheels);BSC/3in1      Prior Function Prior Level of Function : Independent/Modified Independent;Driving  Extremity/Trunk Assessment   Upper Extremity Assessment Upper Extremity Assessment: Overall WFL for tasks assessed    Lower Extremity Assessment Lower Extremity Assessment: LLE deficits/detail LLE Deficits / Details: Able to perform limited heel slide, ankle DF WFL     Cervical / Trunk Assessment Cervical / Trunk Assessment: Normal  Communication   Communication Communication: No apparent difficulties    Cognition Arousal: Alert Behavior During Therapy: WFL for tasks assessed/performed   PT - Cognitive impairments: No apparent impairments                         Following commands: Intact       Cueing Cueing Techniques: Verbal cues     General Comments      Exercises General Exercises - Lower Extremity Ankle Circles/Pumps: AROM, Both, 20 reps, Supine Quad Sets: AROM, Both, 5 reps, Supine Heel Slides: AAROM, Left, 5 reps, Supine Hip ABduction/ADduction: AAROM, Left, 5 reps, Supine   Assessment/Plan    PT Assessment Patient needs continued PT services  PT Problem List Decreased strength;Decreased activity tolerance;Decreased balance;Decreased mobility;Pain       PT Treatment Interventions DME instruction;Gait training;Stair training;Functional mobility training;Therapeutic activities;Therapeutic exercise;Balance training;Patient/family education    PT Goals (Current goals can be found in the Care Plan section)  Acute Rehab PT Goals Patient Stated Goal: go home PT Goal Formulation: With patient/family Time For Goal Achievement: 01/14/25 Potential to Achieve Goals: Good    Frequency Min 3X/week     Co-evaluation               AM-PAC PT 6 Clicks Mobility  Outcome Measure Help needed turning from your back to your side while in a flat bed without using bedrails?: A Little Help needed moving from lying on your back to sitting on the side of a flat bed without using bedrails?: A Little Help needed moving to and from a bed to a chair (including a wheelchair)?: A Little Help needed standing up from a chair using your arms (e.g., wheelchair or bedside chair)?: A Little Help needed to walk in hospital room?: A Little Help needed climbing 3-5 steps with a railing? : A Lot 6 Click Score: 17    End of Session  Equipment Utilized During Treatment: Gait belt Activity Tolerance: Patient tolerated treatment well Patient left: in chair;with call bell/phone within reach;with chair alarm set Nurse Communication: Mobility status PT Visit Diagnosis: Pain;Difficulty in walking, not elsewhere classified (R26.2) Pain - Right/Left: Left Pain - part of body: Hip    Time: 9157-9078 PT Time Calculation (min) (ACUTE ONLY): 39 min   Charges:   PT Evaluation $PT Eval Low Complexity: 1 Low PT Treatments $Therapeutic Activity: 23-37 mins PT General Charges $$ ACUTE PT VISIT: 1 Visit         Aleck Daring, PT, DPT Acute Rehabilitation Services Office [phone]   Aleck ONEIDA Daring 12/31/2024, 9:28 AM

## 2025-01-01 ENCOUNTER — Other Ambulatory Visit (HOSPITAL_COMMUNITY): Payer: Self-pay

## 2025-01-01 DIAGNOSIS — S72002A Fracture of unspecified part of neck of left femur, initial encounter for closed fracture: Secondary | ICD-10-CM | POA: Diagnosis not present

## 2025-01-01 LAB — CBC
HCT: 34.2 % — ABNORMAL LOW (ref 36.0–46.0)
Hemoglobin: 11.6 g/dL — ABNORMAL LOW (ref 12.0–15.0)
MCH: 30.1 pg (ref 26.0–34.0)
MCHC: 33.9 g/dL (ref 30.0–36.0)
MCV: 88.6 fL (ref 80.0–100.0)
Platelets: 116 10*3/uL — ABNORMAL LOW (ref 150–400)
RBC: 3.86 MIL/uL — ABNORMAL LOW (ref 3.87–5.11)
RDW: 13.2 % (ref 11.5–15.5)
WBC: 9.1 10*3/uL (ref 4.0–10.5)
nRBC: 0 % (ref 0.0–0.2)

## 2025-01-01 LAB — HEPARIN LEVEL (UNFRACTIONATED)
Heparin Unfractionated: 0.41 [IU]/mL (ref 0.30–0.70)
Heparin Unfractionated: 0.46 [IU]/mL (ref 0.30–0.70)

## 2025-01-01 MED ORDER — ADULT MULTIVITAMIN W/MINERALS CH
1.0000 | ORAL_TABLET | Freq: Every day | ORAL | Status: DC
  Administered 2025-01-02 – 2025-01-04 (×3): 1 via ORAL
  Filled 2025-01-01 (×3): qty 1

## 2025-01-01 MED ORDER — RIVAROXABAN 15 MG PO TABS
15.0000 mg | ORAL_TABLET | Freq: Two times a day (BID) | ORAL | Status: DC
  Administered 2025-01-01 – 2025-01-04 (×7): 15 mg via ORAL
  Filled 2025-01-01 (×8): qty 1

## 2025-01-01 MED ORDER — OXYCODONE HCL 5 MG PO TABS
5.0000 mg | ORAL_TABLET | Freq: Four times a day (QID) | ORAL | Status: DC | PRN
  Administered 2025-01-01 – 2025-01-04 (×10): 5 mg via ORAL
  Filled 2025-01-01 (×10): qty 1

## 2025-01-01 MED ORDER — POLYETHYLENE GLYCOL 3350 17 G PO PACK
17.0000 g | PACK | Freq: Every day | ORAL | Status: DC
  Administered 2025-01-01 – 2025-01-02 (×2): 17 g via ORAL
  Filled 2025-01-01 (×4): qty 1

## 2025-01-01 MED ORDER — VITAMIN D 25 MCG (1000 UNIT) PO TABS
1000.0000 [IU] | ORAL_TABLET | Freq: Every day | ORAL | Status: DC
  Administered 2025-01-01 – 2025-01-04 (×4): 1000 [IU] via ORAL
  Filled 2025-01-01 (×4): qty 1

## 2025-01-01 MED ORDER — RIVAROXABAN 20 MG PO TABS: 20.0000 mg | ORAL_TABLET | Freq: Every day | ORAL | Status: DC

## 2025-01-01 MED ORDER — ENSURE PLUS HIGH PROTEIN PO LIQD
237.0000 mL | Freq: Two times a day (BID) | ORAL | Status: DC
  Administered 2025-01-01 – 2025-01-03 (×4): 237 mL via ORAL

## 2025-01-01 MED ORDER — HYDROCODONE-ACETAMINOPHEN 5-325 MG PO TABS: 1.0000 | ORAL_TABLET | Freq: Four times a day (QID) | ORAL | Status: DC | PRN

## 2025-01-01 MED ORDER — JUVEN PO PACK
1.0000 | PACK | Freq: Two times a day (BID) | ORAL | Status: DC
  Administered 2025-01-01: 1 via ORAL
  Filled 2025-01-01 (×4): qty 1

## 2025-01-01 MED ORDER — FUROSEMIDE 10 MG/ML IJ SOLN
40.0000 mg | Freq: Two times a day (BID) | INTRAMUSCULAR | Status: AC
  Administered 2025-01-01 (×2): 40 mg via INTRAVENOUS
  Filled 2025-01-01 (×2): qty 4

## 2025-01-01 MED ORDER — SENNOSIDES-DOCUSATE SODIUM 8.6-50 MG PO TABS
1.0000 | ORAL_TABLET | Freq: Two times a day (BID) | ORAL | Status: DC
  Administered 2025-01-01 – 2025-01-04 (×5): 1 via ORAL
  Filled 2025-01-01 (×6): qty 1

## 2025-01-01 MED ORDER — ALBUTEROL SULFATE HFA 108 (90 BASE) MCG/ACT IN AERS: 2.0000 | INHALATION_SPRAY | Freq: Four times a day (QID) | RESPIRATORY_TRACT | Status: DC | PRN

## 2025-01-01 MED ORDER — IPRATROPIUM-ALBUTEROL 0.5-2.5 (3) MG/3ML IN SOLN: 3.0000 mL | Freq: Four times a day (QID) | RESPIRATORY_TRACT | Status: DC | PRN

## 2025-01-01 NOTE — Progress Notes (Signed)
 PHARMACY - ANTICOAGULATION CONSULT NOTE  Pharmacy Consult for Heparin  Indication: pulmonary embolus  Allergies[1]  Patient Measurements: Height: 5' 2.5 (158.8 cm) Weight: 98.4 kg (217 lb) IBW/kg (Calculated) : 51.25 HEPARIN  DW (KG): 74.4  Vital Signs: Temp: 98.6 F (37 C) (01/24 2200) Temp Source: Oral (01/24 2200) BP: 136/55 (01/25 0109) Pulse Rate: 68 (01/24 1505)  Labs: Recent Labs    12/30/24 0101 12/30/24 2024 01/01/25 0119  HGB 14.7 13.7  --   HCT 46.4* 41.6  --   PLT 164 129*  --   LABPROT 13.1  --   --   INR 0.9  --   --   HEPARINUNFRC  --   --  0.41  CREATININE 0.92 0.75  --     Estimated Creatinine Clearance: 69.3 mL/min (by C-G formula based on SCr of 0.75 mg/dL).    Assessment: Patient presented to hospital for hip fracture. POD1 from THA. 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 CT scan positive for PE. Pharmacy consulted for heparin  infusion. Patient received DVT prophylaxis lovenox  45 mg 1/23 at 2230. Given recent surgery and lovenox  dose will do a reduced loading dose.  1/25 AM update:  Heparin  level therapeutic   Goal of Therapy:  Heparin  level 0.3-0.7 units/ml Monitor platelets by anticoagulation protocol: Yes   Plan:  Cont heparin  1200 units/hr Heparin  level with AM labs  Lynwood Mckusick, PharmD, BCPS Clinical Pharmacist Phone: [phone]        [1]  Allergies Allergen Reactions   Methylprednisolone Other (See Comments)    Causes hot flashes    Darvon [Propoxyphene] Nausea And Vomiting   Liraglutide Nausea Only   Lorcaserin Other (See Comments)    HEADACHE   Naltrexone-Bupropion Hcl Er Other (See Comments)    UNABLE TO SWALLOW TABLETS

## 2025-01-01 NOTE — Plan of Care (Signed)
" °  Problem: Education: Goal: Knowledge of General Education information will improve Description: Including pain rating scale, medication(s)/side effects and non-pharmacologic comfort measures Outcome: Not Progressing   Problem: Health Behavior/Discharge Planning: Goal: Ability to manage health-related needs will improve Outcome: Not Progressing   Problem: Clinical Measurements: Goal: Ability to maintain clinical measurements within normal limits will improve Outcome: Not Progressing Goal: Will remain free from infection Outcome: Not Progressing Goal: Diagnostic test results will improve Outcome: Not Progressing Goal: Respiratory complications will improve Outcome: Not Progressing Goal: Cardiovascular complication will be avoided Outcome: Not Progressing   Problem: Elimination: Goal: Will not experience complications related to bowel motility Outcome: Not Progressing Goal: Will not experience complications related to urinary retention Outcome: Not Progressing   Problem: Pain Managment: Goal: General experience of comfort will improve and/or be controlled Outcome: Not Progressing   Problem: Safety: Goal: Ability to remain free from injury will improve Outcome: Not Progressing   "

## 2025-01-01 NOTE — Progress Notes (Signed)
 " PROGRESS NOTE  Natalie Sanders  FMW:[id number] DOB: [birth year] DOA: 12/29/2024 PCP: Andrew Truman GRADE., MD   Brief Narrative: Patient is a 74 year old female with history of GERD, hypothyroidism, PE/DVT who presented after mechanical fall while she was taking out her dog, fell on the driveway.  Developed left lower extremity pain.  No loss of consciousness.  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 Vernetta.  Ambulatory at baseline.  Imaging showed left femoral neck fracture.  Orthopedics consulted.  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 PT/OT done, recommended home health.  Hospital course remarkable for respiratory insufficiency requiring 3 days of oxygen.  CT angiogram of the chest showed acute PE.  Started on anticoagulation. Possibility of discharge tomorrow   Assessment & Plan:  Principal Problem:   Closed left hip fracture, initial encounter (HCC)   Left femoral neck fracture: Secondary to mechanical fall.  Orthopedics consulted.  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  PT/OT evaluation done, recommend home health.  Continue DVT prophylaxis, bowel regimen, pain management.   Respiratory insufficiency/acute PE: Currently requiring 2 to 3 L of oxygen per minute.  CT chest showed acute PE.  Started heparin  drip now converted to Xarelto . She also has history of COPD but does not use oxygen at home.  Chest x-ray showed bilateral basilar reticulonodular densities.  ILD is a possibility. Will give her 2 doses of iv  Lasix  today.  Will check echo. 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  Complained of right hip pain as well after fall.  X-ray of the right hip does not show any fracture or dislocation. Pain has resolved now  Skin tear of right lower extremity, left elbow: Continue dressing, supportive care  History of PE/DVT: Taken off Xarelto  on November 2025.  Has PE again.  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           DVT prophylaxis:SCDs Start: 12/30/24 1749 Place TED hose Start: 12/30/24 1749 SCDs Start: 12/30/24 0216 Rivaroxaban  (XARELTO ) tablet 15 mg  rivaroxaban  (XARELTO ) tablet 20 mg     Code Status: Full Code  Family Communication: None at the bedside  Patient status:Inpatient  Patient is from :Home  Anticipated discharge to: Home with home health  Estimated DC date:1-2 days   Consultants: Orthopedics  Procedures:ORIF  Antimicrobials:  Anti-infectives (From admission, onward)    Start     Dose/Rate Route Frequency Ordered Stop   12/31/24 1000  cephALEXin  (KEFLEX ) capsule 250 mg        250 mg Oral Daily 12/30/24 0929     12/30/24 2008  ceFAZolin  (ANCEF ) IVPB 2g/100 mL premix        2 g 200 mL/hr over 30 Minutes Intravenous Every 6 hours 12/30/24 2008 12/31/24 1141   12/30/24 1845  ceFAZolin  (ANCEF ) IVPB 2g/100 mL premix  Status:  Discontinued        2 g 200 mL/hr over 30 Minutes Intravenous Every 6 hours 12/30/24 1748 12/30/24 2008   12/30/24 1417  vancomycin  (VANCOCIN ) powder  Status:  Discontinued          As needed 12/30/24 1417 12/30/24 1505   12/30/24 1245  ceFAZolin  (ANCEF ) IVPB 2g/100 mL premix        2 g 200 mL/hr over 30 Minutes Intravenous On call to O.R. 12/30/24 1205 12/30/24 1309  12/30/24 1149  ceFAZolin  (ANCEF ) 2-4 GM/100ML-% IVPB       Note to Pharmacy: Effie Sluder O: cabinet override      12/30/24 1149 12/30/24 1322       Subjective: Patient seen and examined at bedside today.  Hemodynamically stable.  Still on 3 L of oxygen.  Does not complain of shortness of breath at rest.  Feels short of breath on ambulation.  No cough.  We discussed about finding of CT angiogram  Objective: Vitals:   01/01/25 0109 01/01/25 0400 01/01/25 0810 01/01/25 0843  BP: (!) 136/55 (!) 118/57  (!) 110/56   Pulse:  74  67  Resp:  16  16  Temp:  98 F (36.7 C)  97.9 F (36.6 C)  TempSrc:  Oral  Oral  SpO2: 94% 95% 95% 93%  Weight:      Height:        Intake/Output Summary (Last 24 hours) at 01/01/2025 1042 Last data filed at 12/31/2024 2153 Gross per 24 hour  Intake --  Output 300 ml  Net -300 ml   Filed Weights   12/29/24 2348 12/30/24 1147  Weight: 99.3 kg 98.4 kg    Examination:   General exam: Overall comfortable, not in distress, obese HEENT: PERRL Respiratory system: Crackles on bilateral bases Cardiovascular system: S1 & S2 heard, RRR.  Gastrointestinal system: Abdomen is nondistended, soft and nontender. Central nervous system: Alert and oriented Extremities: No edema, no clubbing ,no cyanosis, clean surgical wound on the left hip Skin: No rashes, no ulcers,no icterus     Data Reviewed: I have personally reviewed following labs and imaging studies  CBC: Recent Labs  Lab 12/30/24 0101 12/30/24 2024 01/01/25 0426  WBC 11.1* 12.7* 9.1  NEUTROABS 8.8*  --   --   HGB 14.7 13.7 11.6*  HCT 46.4* 41.6 34.2*  MCV 92.6 90.0 88.6  PLT 164 129* 116*   Basic Metabolic Panel: Recent Labs  Lab 12/30/24 0101 12/30/24 2024  NA 139 138  K 3.9 4.1  CL 103 104  CO2 25 24  GLUCOSE 121* 156*  BUN 15 14  CREATININE 0.92 0.75  CALCIUM 9.0 8.7*     Recent Results (from the past 240 hours)  Surgical pcr screen     Status: None   Collection Time: 12/30/24  8:49 AM   Specimen: Nasal Mucosa; Nasal Swab  Result Value Ref Range Status   MRSA, PCR NEGATIVE NEGATIVE Final   Staphylococcus aureus NEGATIVE NEGATIVE Final    Comment: (NOTE) The Xpert SA Assay (FDA approved for NASAL specimens in patients 13 years of age and older), is one component of a comprehensive surveillance program. It is not intended to diagnose infection nor to guide or monitor treatment. Performed at [facility], [address], [place], [place] [address]      Radiology  Studies:   Scheduled Meds:  cephALEXin   250 mg Oral Daily   docusate sodium   100 mg Oral BID   furosemide   40 mg Intravenous Q12H   levothyroxine   75 mcg Oral QAC breakfast   Rivaroxaban   15 mg Oral BID WC   Followed by   [START ON 01/22/2025] rivaroxaban   20 mg Oral Q supper   Continuous Infusions:     LOS: 2 days   Ivonne Mustache, MD [facility] P1/25/2026, 10:42 AM  "

## 2025-01-01 NOTE — Progress Notes (Addendum)
 PHARMACY - ANTICOAGULATION CONSULT NOTE  Pharmacy Consult for Heparin  Indication: pulmonary embolus  Allergies[1]  Patient Measurements: Height: 5' 2.5 (158.8 cm) Weight: 98.4 kg (217 lb) IBW/kg (Calculated) : 51.25 HEPARIN  DW (KG): 74.4  Vital Signs: Temp: 98 F (36.7 C) (01/25 0400) Temp Source: Oral (01/25 0400) BP: 118/57 (01/25 0400) Pulse Rate: 74 (01/25 0400)  Labs: Recent Labs    12/30/24 0101 12/30/24 2024 01/01/25 0119 01/01/25 0426  HGB 14.7 13.7  --  11.6*  HCT 46.4* 41.6  --  34.2*  PLT 164 129*  --  116*  LABPROT 13.1  --   --   --   INR 0.9  --   --   --   HEPARINUNFRC  --   --  0.41 0.46  CREATININE 0.92 0.75  --   --     Estimated Creatinine Clearance: 69.3 mL/min (by C-G formula based on SCr of 0.75 mg/dL).    Assessment: Patient presented to hospital for hip fracture. POD1 from THA. 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 CT scan positive for PE. Pharmacy consulted for heparin  infusion. Patient received DVT prophylaxis lovenox  45 mg 1/23 at 2230. Given recent surgery and lovenox  dose will do a reduced loading dose.  Heparin  level this morning (~3 hours after last level so early) was therapeutic at 0.46, on heparin  infusion at 1200 units/hr. Hgb 11.6, plt 116. No s/sx of bleeding or infusion issues.   Goal of Therapy:  Heparin  level 0.3-0.7 units/ml Monitor platelets by anticoagulation protocol: Yes   Plan:  Continue heparin  infusion at 1200 units/hr Monitor daily heparin  level, CBC, and for s/sx of bleeding  ADDENDUM Plan to transition from heparin  infusion to Xarelto . Discussed with MD and plan for full loading dose for VTE dosing - Xarelto  15 mg BID for 21 days then 20 mg daily thereafter. Will monitor for s/sx of bleeding.   Thank you for allowing pharmacy to participate in this patient's care,  Suzen Sour, PharmD, BCCCP Clinical Pharmacist  Phone: [phone] 01/01/2025 8:23 AM  Please check AMION for all  [facility] phone numbers After 10:00 PM, call Main Pharmacy [phone]      [1]  Allergies Allergen Reactions   Methylprednisolone Other (See Comments)    Causes hot flashes    Darvon [Propoxyphene] Nausea And Vomiting   Liraglutide Nausea Only   Lorcaserin Other (See Comments)    HEADACHE   Naltrexone-Bupropion Hcl Er Other (See Comments)    UNABLE TO SWALLOW TABLETS

## 2025-01-01 NOTE — Progress Notes (Addendum)
 Initial Nutrition Assessment  DOCUMENTATION CODES:   Obesity unspecified  INTERVENTION:  Encourage po intake Emphasis on protein intake 1 packet Juven BID, each packet provides 95 calories, 2.5 grams of protein (collagen),  to support wound healing Ensure Plus High Protein po BID, each supplement provides 350 kcal and 20 grams of protein MVI with minerals daily  100 units Vitamin D  daily for maintenance  NUTRITION DIAGNOSIS:   Increased nutrient needs related to acute illness as evidenced by estimated needs.  GOAL:   Patient will meet greater than or equal to 90% of their needs  MONITOR:   PO intake, Supplement acceptance, Weight trends, Labs  REASON FOR ASSESSMENT:   Consult Assessment of nutrition requirement/status, Hip fracture protocol  ASSESSMENT:   74 year old female with history of GERD, hypothyroidism, PE/DVT who presented after mechanical fall while she was taking out her dog, fell on the driveway.  Found to have left hip fracture s/p left total hip arthroplasty on 12/30/2024.  1/23 - left total hip arthroplasty 1/24 - CT chest with finding of PE  RD working remotely, attempted to call pt's room with no response limited history obtained through chart review. Pt was ambulatory at baseline, had a mechanical fall and is now s/p left total hip arthoplasty. Has been on a diet since post op, currently on regular diet. RN unsure of intake, no meal recorded. Per chart review pt has been weight stable in the last year ranging from 210-217 lbs. Pt with increased nutritional needs related to post op healing. Will add ONS to supplement intake.   Dispo: Per MD, possibility of discharge tomorrow, home with home health  Admit weight: 99.3 kg Current weight: 98.4 kg     Wt Readings from Last 10 Encounters:  12/30/24 98.4 kg  08/02/24 95.7 kg  07/25/24 96.2 kg  05/23/24 96.6 kg  03/14/20 99.8 kg  06/15/18 103.4 kg  06/03/18 103.5 kg  04/16/17 101.6 kg  04/07/17 101.7 kg   02/23/14 94 kg   Average Meal Intake: No meal recorded  Nutritionally Relevant Medications: Scheduled Meds:  cephALEXin   250 mg Oral Daily   furosemide   40 mg Intravenous Q12H   levothyroxine   75 mcg Oral QAC breakfast   polyethylene glycol  17 g Oral Daily   Rivaroxaban   15 mg Oral BID WC   Followed by   NOREEN ON 01/22/2025] rivaroxaban   20 mg Oral Q supper   senna-docusate  1 tablet Oral BID   Labs Reviewed: CBG ranges from 121-156 mg/dL over the last 24 hours No A1C  NUTRITION - FOCUSED PHYSICAL EXAM: - RD working remotely, Exam deferred to follow up   Diet Order:   Diet Order             Diet regular Room service appropriate? Yes; Fluid consistency: Thin  Diet effective now                   EDUCATION NEEDS:   No education needs have been identified at this time  Skin:  Skin Assessment: Skin Integrity Issues: Skin Integrity Issues:: Incisions Incisions: Closed surgical incision, left hip  Last BM:  PTA  Height:   Ht Readings from Last 1 Encounters:  12/30/24 5' 2.5 (1.588 m)    Weight:   Wt Readings from Last 1 Encounters:  12/30/24 98.4 kg    Ideal Body Weight:  52.3 kg  BMI:  Body mass index is 39.06 kg/m.  Estimated Nutritional Needs:   Kcal:  1900-2100 kcal  Protein:  100-120 gm  Fluid:  >1.9L/day   Olivia Kenning, RD Registered Dietitian  See Amion for more information

## 2025-01-01 NOTE — Progress Notes (Signed)
 Physical Therapy Treatment Patient Details Name: Natalie Sanders MRN: [id number] DOB: 10-16-1951 Today's Date: 01/01/2025   History of Present Illness Pt is a 74 y.o. F who presents with left femoral neck fx s/p THA. CT chest on 1/24 with finding of PE. PMH includes R TKA, R THA, herniated cervical disc.    PT Comments  Pt tolerates treatment well, ambulating for increased distances. Pt does require increased physical assistance for bed mobility, appearing more stiff than previous session after not mobilizing much since findings of PE yesterday afternoon. Pt will benefit from frequent mobilization in an effort to improve strength, power, and pulmonary function. HHPT remains recommended pending continued progress.   If plan is discharge home, recommend the following: A little help with walking and/or transfers;A little help with bathing/dressing/bathroom;Assistance with cooking/housework;Assist for transportation;Help with stairs or ramp for entrance   Can travel by private vehicle        Equipment Recommendations  None recommended by PT    Recommendations for Other Services       Precautions / Restrictions Precautions Precautions: Fall Recall of Precautions/Restrictions: Intact Restrictions Weight Bearing Restrictions Per Provider Order: Yes LLE Weight Bearing Per Provider Order: Weight bearing as tolerated     Mobility  Bed Mobility Overal bed mobility: Needs Assistance Bed Mobility: Supine to Sit     Supine to sit: Mod assist, HOB elevated, Used rails          Transfers Overall transfer level: Needs assistance Equipment used: Rolling walker (2 wheels) Transfers: Sit to/from Stand, Bed to chair/wheelchair/BSC Sit to Stand: Contact guard assist   Step pivot transfers: Contact guard assist            Ambulation/Gait Ambulation/Gait assistance: Contact guard assist Gait Distance (Feet): 80 Feet (additional trial of 15') Assistive device: Rolling walker (2  wheels) Gait Pattern/deviations: Step-through pattern Gait velocity: reduced Gait velocity interpretation: <1.8 ft/sec, indicate of risk for recurrent falls   General Gait Details: slowed step-through gait, reduced stance time on LLE   Stairs             Wheelchair Mobility     Tilt Bed    Modified Rankin (Stroke Patients Only)       Balance Overall balance assessment: Needs assistance Sitting-balance support: Feet supported, No upper extremity supported Sitting balance-Leahy Scale: Good     Standing balance support: Single extremity supported, Reliant on assistive device for balance Standing balance-Leahy Scale: Poor                              Communication Communication Communication: No apparent difficulties  Cognition Arousal: Alert Behavior During Therapy: WFL for tasks assessed/performed   PT - Cognitive impairments: No apparent impairments                         Following commands: Intact      Cueing Cueing Techniques: Verbal cues  Exercises      General Comments General comments (skin integrity, edema, etc.): pt on 3L [place] upon PT arrival, PT attempts to wean pt to 1L [place] and pt desats to 81% with bed mobility. Pt maintains 88-91% SpO2 on 3L [place] when ambulating      Pertinent Vitals/Pain Pain Assessment Pain Assessment: Faces Faces Pain Scale: Hurts even more Pain Location: L hip/leg Pain Descriptors / Indicators: Sore Pain Intervention(s): Monitored during session    Home Living  Prior Function            PT Goals (current goals can now be found in the care plan section) Acute Rehab PT Goals Patient Stated Goal: go home Progress towards PT goals: Progressing toward goals    Frequency    Min 3X/week      PT Plan      Co-evaluation              AM-PAC PT 6 Clicks Mobility   Outcome Measure  Help needed turning from your back to your side while in a flat bed  without using bedrails?: A Little Help needed moving from lying on your back to sitting on the side of a flat bed without using bedrails?: A Lot Help needed moving to and from a bed to a chair (including a wheelchair)?: A Little Help needed standing up from a chair using your arms (e.g., wheelchair or bedside chair)?: A Little Help needed to walk in hospital room?: A Little Help needed climbing 3-5 steps with a railing? : A Lot 6 Click Score: 16    End of Session Equipment Utilized During Treatment: Gait belt Activity Tolerance: Patient tolerated treatment well Patient left: in chair;with call bell/phone within reach;with chair alarm set Nurse Communication: Mobility status PT Visit Diagnosis: Pain;Difficulty in walking, not elsewhere classified (R26.2) Pain - Right/Left: Left Pain - part of body: Hip     Time: 8753-8679 PT Time Calculation (min) (ACUTE ONLY): 34 min  Charges:    $Gait Training: 8-22 mins $Therapeutic Activity: 8-22 mins PT General Charges $$ ACUTE PT VISIT: 1 Visit                     Bernardino JINNY Ruth, PT, DPT Acute Rehabilitation Office [phone]    Bernardino JINNY Ruth 01/01/2025, 1:35 PM

## 2025-01-01 NOTE — Plan of Care (Signed)
  Problem: Education: Goal: Knowledge of General Education information will improve Description Including pain rating scale, medication(s)/side effects and non-pharmacologic comfort measures Outcome: Progressing   Problem: Clinical Measurements: Goal: Will remain free from infection Outcome: Progressing   Problem: Nutrition: Goal: Adequate nutrition will be maintained Outcome: Progressing   Problem: Elimination: Goal: Will not experience complications related to urinary retention Outcome: Progressing   

## 2025-01-02 ENCOUNTER — Telehealth (HOSPITAL_COMMUNITY): Payer: Self-pay

## 2025-01-02 ENCOUNTER — Encounter (HOSPITAL_COMMUNITY): Payer: Self-pay | Admitting: Orthopaedic Surgery

## 2025-01-02 ENCOUNTER — Other Ambulatory Visit (HOSPITAL_COMMUNITY): Payer: Self-pay

## 2025-01-02 ENCOUNTER — Inpatient Hospital Stay (HOSPITAL_COMMUNITY)

## 2025-01-02 DIAGNOSIS — I2693 Single subsegmental pulmonary embolism without acute cor pulmonale: Secondary | ICD-10-CM

## 2025-01-02 DIAGNOSIS — S72002A Fracture of unspecified part of neck of left femur, initial encounter for closed fracture: Secondary | ICD-10-CM | POA: Diagnosis not present

## 2025-01-02 LAB — ECHOCARDIOGRAM COMPLETE
AR max vel: 1.69 cm2
AV Area VTI: 1.69 cm2
AV Area mean vel: 1.5 cm2
AV Mean grad: 6 mmHg
AV Peak grad: 10.4 mmHg
Ao pk vel: 1.61 m/s
Area-P 1/2: 3.03 cm2
Calc EF: 70.6 %
Height: 62.5 in
MV VTI: 1.73 cm2
S' Lateral: 2.9 cm
Single Plane A2C EF: 66.9 %
Single Plane A4C EF: 75.5 %
Weight: 3472 [oz_av]

## 2025-01-02 LAB — BASIC METABOLIC PANEL WITH GFR
Anion gap: 10 (ref 5–15)
BUN: 18 mg/dL (ref 8–23)
CO2: 30 mmol/L (ref 22–32)
Calcium: 8.9 mg/dL (ref 8.9–10.3)
Chloride: 97 mmol/L — ABNORMAL LOW (ref 98–111)
Creatinine, Ser: 0.87 mg/dL (ref 0.44–1.00)
GFR, Estimated: >60 mL/min
Glucose, Bld: 99 mg/dL (ref 70–99)
Potassium: 3.7 mmol/L (ref 3.5–5.1)
Sodium: 137 mmol/L (ref 135–145)

## 2025-01-02 LAB — CBC
HCT: 38.5 % (ref 36.0–46.0)
Hemoglobin: 12.8 g/dL (ref 12.0–15.0)
MCH: 29.7 pg (ref 26.0–34.0)
MCHC: 33.2 g/dL (ref 30.0–36.0)
MCV: 89.3 fL (ref 80.0–100.0)
Platelets: 133 10*3/uL — ABNORMAL LOW (ref 150–400)
RBC: 4.31 MIL/uL (ref 3.87–5.11)
RDW: 13.2 % (ref 11.5–15.5)
WBC: 8.5 10*3/uL (ref 4.0–10.5)
nRBC: 0 % (ref 0.0–0.2)

## 2025-01-02 MED ORDER — ORAL CARE MOUTH RINSE: 15.0000 mL | OROMUCOSAL | Status: DC | PRN

## 2025-01-02 MED ORDER — FUROSEMIDE 10 MG/ML IJ SOLN
40.0000 mg | Freq: Two times a day (BID) | INTRAMUSCULAR | Status: AC
  Administered 2025-01-02 (×2): 40 mg via INTRAVENOUS
  Filled 2025-01-02 (×2): qty 4

## 2025-01-02 MED ORDER — PERFLUTREN LIPID MICROSPHERE
1.0000 mL | INTRAVENOUS | Status: AC | PRN
  Administered 2025-01-02: 3 mL via INTRAVENOUS

## 2025-01-02 NOTE — Progress Notes (Signed)
 " PROGRESS NOTE  Natalie Sanders  FMW:[id number] DOB: 1951/08/28 DOA: 12/29/2024 PCP: Andrew Truman GRADE., MD   Brief Narrative: Patient is a 74 year old female with history of GERD, hypothyroidism, PE/DVT who presented after mechanical fall while she was taking out her dog, fell on the driveway.  Developed left lower extremity pain.  No loss of consciousness.  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 Vernetta.  Ambulatory at baseline.  Imaging showed left femoral neck fracture.  Orthopedics consulted.  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 PT/OT done, recommended home health.  Hospital course remarkable for respiratory insufficiency requiring 2-3 of oxygen.  CT angiogram of the chest showed acute PE.  Started on anticoagulation. Trying to wean the oxygen.  Also started on Lasix  because she had bilateral crackles.  Echo pending. Possibility of discharge tomorrow   Assessment & Plan:  Principal Problem:   Closed left hip fracture, initial encounter (HCC)   Left femoral neck fracture: Secondary to mechanical fall.  Orthopedics consulted.  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  PT/OT evaluation done, recommend home health.  Continue DVT prophylaxis, bowel regimen, pain management.   Respiratory insufficiency/acute PE: Currently requiring 2 to 3 L of oxygen per minute.  CT chest showed acute PE.  Started heparin  drip now converted to Xarelto . She also has history of COPD but does not use oxygen at home.  Chest x-ray showed bilateral basilar reticulonodular densities.  ILD is a possibility. Will give her 2 doses of iv  Lasix  again today.  Will check echo.  Renal function stable Continue incentive spirometer  Right hip pain: History of right hip replacement in August 2025.  Complained of right hip pain as well after fall.  X-ray of the right hip does not show any fracture or dislocation. Pain has resolved now  Skin tear of right lower extremity, left elbow:  Continue dressing, supportive care  History of PE/DVT: Taken off Xarelto  on November 2025.  Has PE again.  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      Nutrition Problem: Increased nutrient needs Etiology: acute illness    DVT prophylaxis:SCDs Start: 12/30/24 1749 Place TED hose Start: 12/30/24 1749 SCDs Start: 12/30/24 0216 Rivaroxaban  (XARELTO ) tablet 15 mg  rivaroxaban  (XARELTO ) tablet 20 mg     Code Status: Full Code  Family Communication: None at the bedside  Patient status:Inpatient  Patient is from :Home  Anticipated discharge to: Home with home health  Estimated DC date:1-2 days   Consultants: Orthopedics  Procedures:ORIF  Antimicrobials:  Anti-infectives (From admission, onward)    Start     Dose/Rate Route Frequency Ordered Stop   12/31/24 1000  cephALEXin  (KEFLEX ) capsule 250 mg        250 mg Oral Daily 12/30/24 0929     12/30/24 2008  ceFAZolin  (ANCEF ) IVPB 2g/100 mL premix        2 g 200 mL/hr over 30 Minutes Intravenous Every 6 hours 12/30/24 2008 12/31/24 1141   12/30/24 1845  ceFAZolin  (ANCEF ) IVPB 2g/100 mL premix  Status:  Discontinued        2 g 200 mL/hr over 30 Minutes Intravenous Every 6 hours 12/30/24 1748 12/30/24 2008   12/30/24 1417  vancomycin  (VANCOCIN ) powder  Status:  Discontinued          As needed 12/30/24 1417 12/30/24 1505   12/30/24 1245  ceFAZolin  (  ANCEF ) IVPB 2g/100 mL premix        2 g 200 mL/hr over 30 Minutes Intravenous On call to O.R. 12/30/24 1205 12/30/24 1309   12/30/24 1149  ceFAZolin  (ANCEF ) 2-4 GM/100ML-% IVPB       Note to Pharmacy: Effie Sluder O: cabinet override      12/30/24 1149 12/30/24 1322       Subjective: Patient seen and examined at bedside today.  Hemodynamically stable.  Feels better today.  On 2 L of oxygen today.  Breathing is better than yesterday.  She does not feel  ready to go home yet.  Still has bilateral basilar crackles, we discussed about giving her 2 doses  of Lasix  today  Objective: Vitals:   01/01/25 1642 01/01/25 2050 01/02/25 0602 01/02/25 0741  BP: (!) 129/52 (!) 135/58 (!) 120/52 (!) 125/51  Pulse: 74 90  96  Resp: 16 18 16 16   Temp: 97.9 F (36.6 C) 100.2 F (37.9 C) 98.9 F (37.2 C) 99.6 F (37.6 C)  TempSrc: Oral Oral Oral Oral  SpO2: 99% 98% 98% 91%  Weight:      Height:        Intake/Output Summary (Last 24 hours) at 01/02/2025 1023 Last data filed at 01/02/2025 0815 Gross per 24 hour  Intake 120 ml  Output 4000 ml  Net -3880 ml   Filed Weights   12/29/24 2348 12/30/24 1147  Weight: 99.3 kg 98.4 kg    Examination:  General exam: Overall comfortable, not in distress HEENT: PERRL Respiratory system: Crackles on bilateral bases Cardiovascular system: S1 & S2 heard, RRR.  Gastrointestinal system: Abdomen is nondistended, soft and nontender. Central nervous system: Alert and oriented Extremities: No edema, no clubbing ,no cyanosis,surgical wound on the left hip Skin: No rashes, no ulcers,no icterus      Data Reviewed: I have personally reviewed following labs and imaging studies  CBC: Recent Labs  Lab 12/30/24 0101 12/30/24 2024 01/01/25 0426 01/02/25 0352  WBC 11.1* 12.7* 9.1 8.5  NEUTROABS 8.8*  --   --   --   HGB 14.7 13.7 11.6* 12.8  HCT 46.4* 41.6 34.2* 38.5  MCV 92.6 90.0 88.6 89.3  PLT 164 129* 116* 133*   Basic Metabolic Panel: Recent Labs  Lab 12/30/24 0101 12/30/24 2024 01/02/25 0352  NA 139 138 137  K 3.9 4.1 3.7  CL 103 104 97*  CO2 25 24 30   GLUCOSE 121* 156* 99  BUN 15 14 18   CREATININE 0.92 0.75 0.87  CALCIUM 9.0 8.7* 8.9     Recent Results (from the past 240 hours)  Surgical pcr screen     Status: None   Collection Time: 12/30/24  8:49 AM   Specimen: Nasal Mucosa; Nasal Swab  Result Value Ref Range Status   MRSA, PCR NEGATIVE NEGATIVE Final   Staphylococcus aureus  NEGATIVE NEGATIVE Final    Comment: (NOTE) The Xpert SA Assay (FDA approved for NASAL specimens in patients 71 years of age and older), is one component of a comprehensive surveillance program. It is not intended to diagnose infection nor to guide or monitor treatment. Performed at [facility], [address] [place], [place] [address]      Radiology Studies:   Scheduled Meds:  cephALEXin   250 mg Oral Daily   cholecalciferol   1,000 Units Oral Daily   feeding supplement  237 mL Oral BID BM   furosemide   40 mg Intravenous Q12H   levothyroxine   75 mcg Oral  QAC breakfast   multivitamin with minerals  1 tablet Oral Daily   nutrition supplement (JUVEN)  1 packet Oral BID BM   polyethylene glycol  17 g Oral Daily   Rivaroxaban   15 mg Oral BID WC   Followed by   NOREEN ON 01/22/2025] rivaroxaban   20 mg Oral Q supper   senna-docusate  1 tablet Oral BID   Continuous Infusions:     LOS: 3 days   Ivonne Mustache, MD [facility] P1/26/2026, 10:23 AM  "

## 2025-01-02 NOTE — TOC CAGE-AID Note (Signed)
 Transition of Care [facility]) - CAGE-AID Screening   Patient Details  Name: FRIMET DURFEE MRN: [id number] Date of Birth: 01/29/1951  [facility] of [facility]) CM/SW Contact:    LEBRON ROCKIE ORN, RN Phone Number: [phone] 01/02/2025, 12:09 PM   Clinical Narrative: Pt denies alcohol or drug use.  Screening complete.    CAGE-AID Screening:    Have You Ever Felt You Ought to Cut Down on Your Drinking or Drug Use?: No Have People Annoyed You By Critizing Your Drinking Or Drug Use?: No Have You Felt Bad Or Guilty About Your Drinking Or Drug Use?: No Have You Ever Had a Drink or Used Drugs First Thing In The Morning to Steady Your Nerves or to Get Rid of a Hangover?: No CAGE-AID Score: 0  Substance Abuse Education Offered: No

## 2025-01-02 NOTE — Care Management Important Message (Deleted)
 Important Message  Patient Details  Name: Natalie Sanders MRN: [id number] Date of Birth: 06-01-1951   Important Message Given:        Rosalva Jon Bloch, RN 01/02/2025, 8:05 AM

## 2025-01-02 NOTE — Telephone Encounter (Signed)
 Pharmacy Patient Advocate Encounter  Insurance verification completed.    The patient is insured through [employer]. Patient has Medicare and is not eligible for a copay card, but may be able to apply for patient assistance or Medicare RX Payment Plan (Patient Must reach out to their plan, if eligible for payment plan), if available.    Ran test claim for Xarelto  20mg  tablet and the current 30 day co-pay is $206.14 due to deductible.   This test claim was processed through [facility]- copay amounts may vary at other pharmacies due to pharmacy/plan contracts, or as the patient moves through the different stages of their insurance plan.

## 2025-01-02 NOTE — Progress Notes (Signed)
 Occupational Therapy Treatment Patient Details Name: Natalie Sanders MRN: [id number] DOB: March 07, 1951 Today's Date: 01/02/2025   History of present illness Pt is a 74 y.o. F who presents with left femoral neck fx s/p THA. CT chest on 1/24 with finding of PE. PMH includes R TKA, R THA, herniated cervical disc.   OT comments  Pt progressing well towards OT goals. Focus of session on progressing functional mobility and increasing independent engagement in ADL tasks. Pt required up to Mod A for functional bed mobility this day and CGA for sit to stand transfers. Pt educated on AE for LB ADL engagement. Continues to require increased assistance for OOB ADL tasks, demonstrates improved activity tolerance this date. Pt continues to benefit from acute OT services, continue per POC.       If plan is discharge home, recommend the following:  A little help with walking and/or transfers;A little help with bathing/dressing/bathroom;Assistance with cooking/housework;Assist for transportation;Help with stairs or ramp for entrance   Equipment Recommendations  Other (comment) (currently has O2 needs)    Recommendations for Other Services      Precautions / Restrictions Precautions Precautions: Fall Recall of Precautions/Restrictions: Intact Restrictions Weight Bearing Restrictions Per Provider Order: Yes LLE Weight Bearing Per Provider Order: Weight bearing as tolerated       Mobility Bed Mobility Overal bed mobility: Needs Assistance Bed Mobility: Supine to Sit, Sit to Supine     Supine to sit: Mod assist, HOB elevated, Used rails Sit to supine: Mod assist   General bed mobility comments: Mod A to assist in bed mobility for management of LLE and to assist in scooting hips to EOB. Increased time and effort required, Pt provided with dense cues to sequence body mechanics for effective scoot. Mod A to manage BLE back to bed for return to supine. Verbal cues once supine to manage body to midline  position.    Transfers Overall transfer level: Needs assistance Equipment used: Rolling walker (2 wheels) Transfers: Sit to/from Stand Sit to Stand: Contact guard assist, From elevated surface           General transfer comment: CGA from bed slightly elevated x2. Pt required explicit verbal cues each trial for proper hand placement on RW. Pt took side steps to the L towards HOB.     Balance Overall balance assessment: Needs assistance Sitting-balance support: Feet supported, No upper extremity supported Sitting balance-Leahy Scale: Good     Standing balance support: Bilateral upper extremity supported, During functional activity, Reliant on assistive device for balance Standing balance-Leahy Scale: Poor Standing balance comment: Dependent on RW                           ADL either performed or assessed with clinical judgement   ADL Overall ADL's : Needs assistance/impaired             Lower Body Bathing: Moderate assistance;Sitting/lateral leans;Sit to/from stand Lower Body Bathing Details (indicate cue type and reason): Educated on LB AE for bathing     Lower Body Dressing: Moderate assistance;Sitting/lateral leans;Sit to/from stand Lower Body Dressing Details (indicate cue type and reason): Educated on LB AE for dressing                    Extremity/Trunk Assessment Upper Extremity Assessment Upper Extremity Assessment: Overall WFL for tasks assessed            Vision       Perception  Praxis     Communication Communication Communication: No apparent difficulties   Cognition Arousal: Alert Behavior During Therapy: WFL for tasks assessed/performed Cognition: No apparent impairments                               Following commands: Intact        Cueing   Cueing Techniques: Verbal cues  Exercises      Shoulder Instructions       General Comments Pt on 2L [place] upon arrival. Pt able to maintain SpO2 >93%  during functional activity provided with rest break. Remained on 2L [place] for session. Pt eucated on AE for LB ADLs. Pt expressing familiarity with equipment.    Pertinent Vitals/ Pain       Pain Assessment Pain Assessment: Faces Faces Pain Scale: Hurts even more Pain Location: L hip with mobility Pain Descriptors / Indicators: Discomfort, Sore, Tender Pain Intervention(s): Limited activity within patient's tolerance, Monitored during session, Premedicated before session  Home Living                                          Prior Functioning/Environment              Frequency  Min 2X/week        Progress Toward Goals  OT Goals(current goals can now be found in the care plan section)  Progress towards OT goals: Progressing toward goals  Acute Rehab OT Goals Patient Stated Goal: to get better OT Goal Formulation: With patient/family Time For Goal Achievement: 01/14/25 Potential to Achieve Goals: Good ADL Goals Pt Will Perform Upper Body Dressing: with set-up Pt Will Transfer to Toilet: with supervision Pt Will Perform Toileting - Clothing Manipulation and hygiene: with supervision Additional ADL Goal #1: Pt will be able to tolerate >5 mins of upright activity to maximize activity tolerance and return to PLOF  Plan      Co-evaluation                 AM-PAC OT 6 Clicks Daily Activity     Outcome Measure   Help from another person eating meals?: None Help from another person taking care of personal grooming?: A Little Help from another person toileting, which includes using toliet, bedpan, or urinal?: A Little Help from another person bathing (including washing, rinsing, drying)?: A Lot Help from another person to put on and taking off regular upper body clothing?: A Little Help from another person to put on and taking off regular lower body clothing?: A Lot 6 Click Score: 17    End of Session Equipment Utilized During Treatment: Rolling  walker (2 wheels)  OT Visit Diagnosis: Unsteadiness on feet (R26.81);Other abnormalities of gait and mobility (R26.89);Muscle weakness (generalized) (M62.81);Pain Pain - Right/Left: Left Pain - part of body: Hip   Activity Tolerance Patient tolerated treatment well   Patient Left in bed;with call bell/phone within reach   Nurse Communication          Time: 8498-8473 OT Time Calculation (min): 25 min  Charges: OT General Charges $OT Visit: 1 Visit OT Treatments $Therapeutic Activity: 23-37 mins  Maurilio CROME, OTR/L.  [facility] Acute Rehabilitation  Office: [phone]   Maurilio PARAS Keonta Alsip 01/02/2025, 4:04 PM

## 2025-01-03 ENCOUNTER — Other Ambulatory Visit (HOSPITAL_COMMUNITY): Payer: Self-pay

## 2025-01-03 ENCOUNTER — Inpatient Hospital Stay (HOSPITAL_COMMUNITY)

## 2025-01-03 DIAGNOSIS — S72002A Fracture of unspecified part of neck of left femur, initial encounter for closed fracture: Secondary | ICD-10-CM | POA: Diagnosis not present

## 2025-01-03 MED ORDER — OXYCODONE HCL 5 MG PO TABS
5.0000 mg | ORAL_TABLET | Freq: Four times a day (QID) | ORAL | 0 refills | Status: DC | PRN
  Filled 2025-01-03: qty 15, 4d supply, fill #0

## 2025-01-03 MED ORDER — METHOCARBAMOL 500 MG PO TABS
500.0000 mg | ORAL_TABLET | Freq: Three times a day (TID) | ORAL | 0 refills | Status: AC | PRN
  Filled 2025-01-03: qty 20, 7d supply, fill #0

## 2025-01-03 MED ORDER — DICLOFENAC SODIUM 1 % EX GEL
2.0000 g | Freq: Four times a day (QID) | CUTANEOUS | Status: DC
  Administered 2025-01-03 – 2025-01-04 (×3): 2 g via TOPICAL
  Filled 2025-01-03: qty 100

## 2025-01-03 MED ORDER — ALBUTEROL SULFATE HFA 108 (90 BASE) MCG/ACT IN AERS
2.0000 | INHALATION_SPRAY | Freq: Four times a day (QID) | RESPIRATORY_TRACT | 0 refills | Status: AC | PRN
  Filled 2025-01-03: qty 6.7, 25d supply, fill #0

## 2025-01-03 MED ORDER — POLYETHYLENE GLYCOL 3350 17 GM/SCOOP PO POWD
17.0000 g | Freq: Every day | ORAL | 0 refills | Status: AC
  Filled 2025-01-03: qty 238, 14d supply, fill #0

## 2025-01-03 MED ORDER — BISACODYL 10 MG RE SUPP
10.0000 mg | Freq: Once | RECTAL | Status: DC
  Filled 2025-01-03: qty 1

## 2025-01-03 MED ORDER — RIVAROXABAN 20 MG PO TABS
20.0000 mg | ORAL_TABLET | Freq: Every day | ORAL | 0 refills | Status: AC
  Filled 2025-01-03: qty 30, 30d supply, fill #0

## 2025-01-03 MED ORDER — RIVAROXABAN 15 MG PO TABS
15.0000 mg | ORAL_TABLET | Freq: Two times a day (BID) | ORAL | 0 refills | Status: AC
  Filled 2025-01-03: qty 38, 19d supply, fill #0

## 2025-01-03 MED ORDER — BISACODYL 10 MG RE SUPP
10.0000 mg | Freq: Once | RECTAL | Status: AC
  Administered 2025-01-03: 10 mg via RECTAL
  Filled 2025-01-03: qty 1

## 2025-01-03 MED ORDER — SENNOSIDES-DOCUSATE SODIUM 8.6-50 MG PO TABS
1.0000 | ORAL_TABLET | Freq: Two times a day (BID) | ORAL | 0 refills | Status: AC
  Filled 2025-01-03: qty 14, 7d supply, fill #0

## 2025-01-03 NOTE — Discharge Summary (Signed)
 Physician Discharge Summary  Natalie Sanders FMW:[id number] DOB: Mar 24, 1951 DOA: 12/29/2024  PCP: Andrew Truman GRADE., MD  Admit date: 12/29/2024 Discharge date: 01/03/2025  Admitted From: Home Disposition:  Home  Discharge Condition:Stable CODE STATUS:FULL Diet recommendation: Regular   Brief/Interim Summary: Patient is a 74 year old female with history of GERD, hypothyroidism, PE/DVT who presented after mechanical fall while she was taking out her dog, fell on the driveway.  Developed left lower extremity pain.  No loss of consciousness.  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 Vernetta.  Ambulatory at baseline.  Imaging showed left femoral neck fracture.  Orthopedics consulted.  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 PT/OT done, recommended home health.  Hospital course remarkable for respiratory insufficiency requiring 2-3 of oxygen.  CT angiogram of the chest showed acute PE.  Started on anticoagulation. Trying to wean the oxygen.  Also started on Lasix  because she had bilateral crackles.  She qualified for home oxygen for 2 L/min.  Medically stable for discharge.  Following problems were addressed during the hospitalization:  Left femoral neck fracture: Secondary to mechanical fall.  Orthopedics consulted.  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  PT/OT evaluation done, recommend home health.  Continue DVT prophylaxis, bowel regimen, pain management.  Follow-up with orthopedics in 2 weeks.   Respiratory insufficiency/acute PE: Currently requiring 2 to 3 L of oxygen per minute.  CT chest showed acute PE.  Started heparin  drip now converted to Xarelto . She also has history of COPD but does not use oxygen at home.  Chest x-ray showed bilateral basilar reticulonodular densities.  ILD is a possibility. Will give her few doses of iv  Lasix  .  Echo showed EF of 60 to 65%, no wall motion abnormality, normal diastolic parameters  Right hip pain: History of  right hip replacement in August 2025.  Complained of right hip pain as well after fall.  X-ray of the right hip does not show any fracture or dislocation. Pain has resolved now   Skin tear of right lower extremity, left elbow: Continue dressing, supportive care   History of PE/DVT: Taken off Xarelto  on November 2025.  Has PE again.  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   Increase activity slowly   Complete by: As directed    No wound care   Complete by: As directed    Pulmonary Visit   Complete by: As directed    Reason for referral: ILD/Pulmonary Fibrosis (IPF)   Weight bearing as tolerated   Complete by: As directed       Allergies as of 01/03/2025       Reactions   Methylprednisolone Other (See Comments)   Causes hot flashes   Darvon [propoxyphene] Nausea And Vomiting   Liraglutide Nausea Only   Lorcaserin Other (See Comments)   HEADACHE   Naltrexone-bupropion Hcl Er Other (See Comments)   UNABLE TO  SWALLOW TABLETS        Medication List     STOP taking these medications    fluconazole  150 MG tablet Commonly known as: DIFLUCAN    HYDROcodone -acetaminophen  5-325 MG tablet Commonly known as: NORCO/VICODIN       TAKE these medications    rOPINIRole 3 MG tablet Commonly known as: REQUIP Take 3 mg by mouth at bedtime. The timing of this medication is very important.   acetaminophen  500 MG  tablet Commonly known as: TYLENOL  Take 1,000 mg by mouth 2 (two) times daily as needed for moderate pain.   albuterol  108 (90 Base) MCG/ACT inhaler Commonly known as: VENTOLIN  HFA Inhale 2 puffs into the lungs every 6 (six) hours as needed for wheezing or shortness of breath.   Bimzelx 160 MG/ML pen Generic drug: bimekizumab-bkzx Inject 320 mg into the skin every 8 (eight) weeks.   cephALEXin  250 MG capsule Commonly known as: KEFLEX  Take 250 mg by mouth daily.   [employer] CALCIUM CHEWS PO Take 1 tablet by mouth daily.   methocarbamol  500 MG tablet Commonly known as: ROBAXIN  Take 1 tablet (500 mg total) by mouth every 8 (eight) hours as needed for muscle spasms.   ondansetron  4 MG tablet Commonly known as: Zofran  Take 1 tablet (4 mg total) by mouth every 8 (eight) hours as needed for nausea or vomiting.   OPTI-FREE REWETTING DROPS Soln Place 1 drop into both eyes daily as needed (dry eyes).   oxyCODONE  5 MG immediate release tablet Commonly known as: Oxy IR/ROXICODONE  Take 1 tablet (5 mg total) by mouth every 6 (six) hours as needed for moderate pain (pain score 4-6).   pantoprazole  40 MG tablet Commonly known as: PROTONIX  Take 40 mg by mouth daily.   polyethylene glycol 17 g packet Commonly known as: MIRALAX  / GLYCOLAX  Take 17 g by mouth daily. Start taking on: January 04, 2025   PreserVision AREDS 2 Caps Take 1 capsule by mouth in the morning and at bedtime.   Rivaroxaban  15 MG Tabs tablet Commonly known as: XARELTO  Take 1 tablet (15 mg total) by mouth 2 (two) times daily with a meal. What changed:  medication strength how much to take when to take this   rivaroxaban  20 MG Tabs tablet Commonly known as: XARELTO  Take 1 tablet (20 mg total) by mouth daily with supper. Start taking on: January 22, 2025 What changed: You were already taking a medication with the same name, and this prescription was added. Make sure you understand how and when to take each.    sennosides-docusate sodium  8.6-50 MG tablet Commonly known as: SENOKOT-S Take 2 tablets by mouth daily. What changed:  when to take this reasons to take this   senna-docusate 8.6-50 MG tablet Commonly known as: Senokot-S Take 1 tablet by mouth 2 (two) times daily for 7 days. What changed: You were already taking a medication with the same name, and this prescription was added. Make sure you understand how and when to take each.   silver sulfADIAZINE 1 % cream Commonly known as: SILVADENE Apply 1 Application topically daily as needed (for rash,burn).   simvastatin  40 MG tablet Commonly known as: ZOCOR  Take 40 mg by mouth at bedtime.   Synthroid  75 MCG tablet Generic drug: levothyroxine  Take 75 mcg by mouth daily before breakfast.   triamcinolone ointment 0.1 % Commonly known as: KENALOG Apply 1 Application topically daily as needed (itching).   Vitamin D  (Ergocalciferol ) 1.25 MG (50000 UNIT) Caps capsule Commonly known as: DRISDOL  Take 50,000 Units by mouth every Saturday.   Vitamin D -3 125 MCG (5000 UT) Tabs Take 1 tablet by mouth daily.               Durable Medical Equipment  (From admission, onward)           Start     Ordered   01/03/25 1025  For home use only DME oxygen  Once       Question Answer Comment  Length of Need Lifetime   Mode or (Route) Nasal cannula   Liters per Minute 2   Frequency Continuous (stationary and portable oxygen unit needed)   Oxygen delivery system: Gas      01/03/25 1024              Discharge Care Instructions  (From admission, onward)           Start     Ordered   12/30/24 0000  Weight bearing as tolerated        12/30/24 1219            Contact information for follow-up providers     Jerri Kay HERO, MD Follow up in 2 week(s).   Specialty: Orthopedic Surgery Why: For suture removal, For wound re-check Contact information: [address] [place] [address] [phone]          Andrew Truman GRADE., MD. Schedule an appointment as soon as possible for a visit in 1 week(s).   Specialty: Internal Medicine Contact information: [address] [place] [address] [phone]              Contact information for after-discharge care     Home Medical Care     [facility]) .   Service: Home Health Services Contact information: [address] [place]  [phone]                    Allergies[1]  Consultations: Orthopedics   Procedures/Studies:    Subjective: Patient seen and examined at bedside today.  Hemodynamically stable.  On 2 L of oxygen . She qualified   for home oxygen.  She feels ready to go home today.  Medically stable for discharge.  Discharge Exam: Vitals:   01/03/25 0346 01/03/25 0735  BP: 133/61 126/61  Pulse: 89 80  Resp: 17 18  Temp: 98 F (36.7 C) 98 F (36.7 C)  SpO2: 96% 94%   Vitals:   01/02/25 1714 01/02/25 2026 01/03/25 0346 01/03/25 0735  BP: (!) 138/57 139/64 133/61 126/61  Pulse: 89 85 89 80  Resp: 18 18 17 18   Temp: 98.3 F (36.8 C) 99.1 F (37.3 C) 98 F (36.7 C) 98 F (36.7 C)  TempSrc:  Oral Oral Oral  SpO2: 94% 98% 96% 94%  Weight:      Height:        General: Pt is alert, awake, not in acute distress Cardiovascular: RRR, S1/S2 +, no rubs, no gallops Respiratory: CTA bilaterally, no wheezing, no rhonchi Abdominal: Soft, NT, ND, bowel sounds + Extremities: no edema, no cyanosis    The results of significant diagnostics from this hospitalization (including imaging, microbiology, ancillary and laboratory) are listed below for reference.     Microbiology: Recent Results (from the past 240 hours)  Surgical pcr screen     Status: None   Collection Time: 12/30/24  8:49 AM   Specimen: Nasal Mucosa; Nasal Swab  Result Value Ref  Range Status   MRSA, PCR NEGATIVE NEGATIVE Final   Staphylococcus aureus NEGATIVE NEGATIVE Final    Comment:  (NOTE) The Xpert SA Assay (FDA approved for NASAL specimens in patients 71 years of age and older), is one component of a comprehensive surveillance program. It is not intended to diagnose infection nor to guide or monitor treatment. Performed at [facility], [address], [place], [place] [address]      Labs: BNP (last 3 results) No results for input(s): BNP in the last 8760 hours. Basic Metabolic Panel: Recent Labs  Lab 12/30/24 0101 12/30/24 2024 01/02/25 0352  NA 139 138 137  K 3.9 4.1 3.7  CL 103 104 97*  CO2 25 24 30   GLUCOSE 121* 156* 99  BUN 15 14 18   CREATININE 0.92 0.75 0.87  CALCIUM 9.0 8.7* 8.9   Liver Function Tests: No results for input(s): AST, ALT, ALKPHOS, BILITOT, PROT, ALBUMIN in the last 168 hours. No results for input(s): LIPASE, AMYLASE in the last 168 hours. No results for input(s): AMMONIA in the last 168 hours. CBC: Recent Labs  Lab 12/30/24 0101 12/30/24 2024 01/01/25 0426 01/02/25 0352  WBC 11.1* 12.7* 9.1 8.5  NEUTROABS 8.8*  --   --   --   HGB 14.7 13.7 11.6* 12.8  HCT 46.4* 41.6 34.2* 38.5  MCV 92.6 90.0 88.6 89.3  PLT 164 129* 116* 133*   Cardiac Enzymes: No results for input(s): CKTOTAL, CKMB, CKMBINDEX, TROPONINI in the last 168 hours. BNP: Invalid input(s): POCBNP CBG: No results for input(s): GLUCAP in the last 168 hours. D-Dimer No results for input(s): DDIMER in the last 72 hours. Hgb A1c No results for input(s): HGBA1C in the last 72 hours. Lipid Profile No results for input(s): CHOL, HDL, LDLCALC, TRIG, CHOLHDL, LDLDIRECT in the last 72 hours. Thyroid function studies No results for input(s): TSH, T4TOTAL, T3FREE, THYROIDAB in the last 72 hours.  Invalid input(s): FREET3 Anemia work up No results for input(s): VITAMINB12, FOLATE, FERRITIN, TIBC, IRON, RETICCTPCT in the last 72 hours. Urinalysis No results found for: COLORURINE,  APPEARANCEUR, LABSPEC, PHURINE, GLUCOSEU, HGBUR, BILIRUBINUR, KETONESUR, PROTEINUR, UROBILINOGEN, NITRITE, LEUKOCYTESUR Sepsis Labs Recent Labs  Lab 12/30/24 0101 12/30/24 2024 01/01/25 0426 01/02/25 0352  WBC 11.1* 12.7* 9.1 8.5   Microbiology Recent Results (from the past 240 hours)  Surgical pcr screen     Status: None   Collection Time: 12/30/24  8:49 AM   Specimen: Nasal Mucosa; Nasal Swab  Result Value Ref Range Status   MRSA, PCR NEGATIVE NEGATIVE Final   Staphylococcus aureus NEGATIVE NEGATIVE Final    Comment: (NOTE) The Xpert SA Assay (FDA approved for NASAL specimens in patients 81 years of age and older), is one component of a comprehensive surveillance program. It is not intended to diagnose infection nor to guide or monitor treatment. Performed at [facility], [address], [place], [place] [address]     Please note: You were cared for by a hospitalist during your hospital stay. Once you are discharged, your primary care physician will handle any further medical issues. Please note that NO REFILLS for any discharge medications will be authorized once you are discharged, as it is imperative that you return to your primary care physician (or establish a relationship with a primary care physician if you do not have one) for your post hospital discharge needs so that they can reassess your need for medications and monitor your lab values.    Time coordinating  discharge: 40 minutes  SIGNED:   Ivonne Mustache, MD  [facility] 01/03/2025, 10:38 AM Pager [phone]  If 7PM-7AM, please contact night-coverage [email or web address] Password [email or web address]    [1]  Allergies Allergen Reactions   Methylprednisolone Other (See Comments)    Causes hot flashes    Darvon [Propoxyphene] Nausea And Vomiting   Liraglutide Nausea Only   Lorcaserin Other (See Comments)    HEADACHE   Naltrexone-Bupropion Hcl Er Other (See Comments)    UNABLE TO  SWALLOW TABLETS

## 2025-01-03 NOTE — TOC Transition Note (Addendum)
 Transition of Care [facility]) - Discharge Note   Patient Details  Name: Natalie Sanders MRN: [id number] Date of Birth: Mar 15, 1951  Transition of Care [facility]) CM/SW Contact:  Rosalva Jon Bloch, RN Phone Number: 01/03/2025, 1:53 PM   Clinical Narrative:    Patient will DC to: home Anticipated DC date: 01/03/2025 Family notified: yes Transport by: car       - s/p  L THA  1/23 Per MD patient ready for DC today . RN, patient, and patient's husband. Pt agreeable to home health services. [facility]ference[facility]. Referral made with [facility] and accepted. Referral made with [employer] for DME oxygen. Equipment will be delivered to bedside prior to d/c. Pt without RX med concern or transportation issues. Post hospital f/u noted on AVS.  RNCM will sign off for now as intervention is no longer needed. Please consult us  again if new needs arise.         Patient Goals and CMS Choice     Choice offered to / list presented to : Patient      Discharge Placement                       Discharge Plan and Services Additional resources added to the After Visit Summary for     Discharge Planning Services: CM Consult            DME Arranged: Oxygen DME Agency: [employer] Date DME Agency Contacted: 01/03/25 Time DME Agency Contacted: 1207 Representative spoke with at DME Agency: [place] HH Arranged: PT, OT HH Agency: [facility]   Time [facility] Agency Contacted: 1208 Representative spoke with at [facility] Agency: [place]  Social Drivers of Health (SDOH) Interventions SDOH Screenings   Food Insecurity: No Food Insecurity (12/31/2024)  Housing: Low Risk (12/31/2024)  Transportation Needs: No Transportation Needs (12/31/2024)  Utilities: Not At Risk (12/31/2024)  Financial Resource Strain: Low Risk (09/27/2024)   Received from [facility]  Physical Activity: Insufficiently Active (06/21/2022)   Received from [facility] visits prior to 02/07/2023., [facility]  Social Connections: Socially Integrated (12/31/2024)  Stress: No Stress Concern Present (12/23/2023)   Received from [facility]  Tobacco Use: Medium Risk (12/30/2024)     Readmission Risk Interventions     No data to display

## 2025-01-03 NOTE — Progress Notes (Signed)
SATURATION QUALIFICATIONS: (This note is used to comply with regulatory documentation for home oxygen)  Patient Saturations on Room Air at Rest =90%  Patient Saturations on Room Air while Ambulating = 86%  Patient Saturations on 2 Liters of oxygen while Ambulating = 92%  Please briefly explain why patient needs home oxygen:

## 2025-01-03 NOTE — Progress Notes (Signed)
 Patient is feeling nauseous and throw up even after getting the zofran . The MD was notified and he gave suppository to her the patient. Will continue to monitor

## 2025-01-03 NOTE — Progress Notes (Signed)
" ° °  Subjective:  Patient reports pain as mild.    Today's  total administered Morphine  Milligram Equivalents: 15  Objective:   VITALS:   Vitals:   01/02/25 1714 01/02/25 2026 01/03/25 0346 01/03/25 0735  BP: (!) 138/57 139/64 133/61 126/61  Pulse: 89 85 89 80  Resp: 18 18 17 18   Temp: 98.3 F (36.8 C) 99.1 F (37.3 C) 98 F (36.7 C) 98 F (36.7 C)  TempSrc:  Oral Oral Oral  SpO2: 94% 98% 96% 94%  Weight:      Height:        Intact pulses distally Dorsiflexion/Plantar flexion intact Incision: dressing C/D/I and no drainage No cellulitis present Compartment soft   Lab Results  Component Value Date   WBC 8.5 01/02/2025   HGB 12.8 01/02/2025   HCT 38.5 01/02/2025   MCV 89.3 01/02/2025   PLT 133 (L) 01/02/2025     Assessment/Plan:  4 Days Post-Op   - Expected postop acute blood loss anemia - Up with PT/OT - on xarelto  for postop PE, has h/o PE last year - WBAT operative extremity - stable from ortho stand point for d/c  Weightbearing: WBAT LLE Insicional and dressing care: Dressings left intact until follow-up Orthopedic device(s): None Showering: yes  Pain control: percocet in chart Follow - up plan: 2 weeks Contact information:  Jerri MD, Jule PA   Natalie Sanders 01/03/2025, 9:47 AM  "

## 2025-01-03 NOTE — Plan of Care (Signed)
   Problem: Education: Goal: Knowledge of General Education information will improve Description Including pain rating scale, medication(s)/side effects and non-pharmacologic comfort measures Outcome: Progressing

## 2025-01-03 NOTE — Progress Notes (Signed)
 Physical Therapy Treatment Patient Details Name: Natalie Sanders MRN: [id number] DOB: [birth year] Today's Date: 01/03/2025   History of Present Illness Pt is a 74 y.o. F who presents with left femoral neck fx s/p THA. CT chest on 1/24 with finding of PE. PMH includes R TKA, R THA, herniated cervical disc.    PT Comments  Pt pleasant reports continued nausea but willing to attempt therapy with plan for gait and stairs however, upon standing pt with increased nausea, returned to sitting and vomited. Pt able to perform limited seated HEp and verbal education for stair sequence. Will continue to follow.   95% on RA    If plan is discharge home, recommend the following: A little help with walking and/or transfers;A little help with bathing/dressing/bathroom;Assistance with cooking/housework;Assist for transportation;Help with stairs or ramp for entrance   Can travel by private vehicle        Equipment Recommendations  None recommended by PT    Recommendations for Other Services       Precautions / Restrictions Precautions Precautions: Fall Recall of Precautions/Restrictions: Intact Restrictions LLE Weight Bearing Per Provider Order: Weight bearing as tolerated     Mobility  Bed Mobility               General bed mobility comments: in chair on arrival and end of session    Transfers Overall transfer level: Modified independent                 General transfer comment: good hand placement to rise and lower at chair, pt with immediate nausea with standing unable to step and vomiting once sitting    Ambulation/Gait                   Stairs             Wheelchair Mobility     Tilt Bed    Modified Rankin (Stroke Patients Only)       Balance Overall balance assessment: Needs assistance Sitting-balance support: Feet supported, No upper extremity supported Sitting balance-Leahy Scale: Good     Standing balance support: Bilateral upper  extremity supported, During functional activity, Reliant on assistive device for balance Standing balance-Leahy Scale: Poor Standing balance comment: Dependent on RW                            Communication Communication Communication: No apparent difficulties  Cognition Arousal: Alert Behavior During Therapy: WFL for tasks assessed/performed   PT - Cognitive impairments: No apparent impairments                         Following commands: Intact      Cueing Cueing Techniques: Verbal cues  Exercises Total Joint Exercises Long Arc Quad: AROM, Left, Seated, Strengthening, 15 reps Marching in Standing: AAROM, Left, Seated, Strengthening, 15 reps    General Comments        Pertinent Vitals/Pain Pain Assessment Pain Assessment: 0-10 Pain Score: 5  Pain Location: lt knee and hip Pain Descriptors / Indicators: Discomfort, Sore, Tender, Aching Pain Intervention(s): Limited activity within patient's tolerance, Monitored during session, Repositioned    Home Living                          Prior Function            PT Goals (current goals can now be found in  the care plan section) Progress towards PT goals: Not progressing toward goals - comment (limited by N/V)    Frequency    Min 3X/week      PT Plan      Co-evaluation              AM-PAC PT 6 Clicks Mobility   Outcome Measure  Help needed turning from your back to your side while in a flat bed without using bedrails?: A Little Help needed moving from lying on your back to sitting on the side of a flat bed without using bedrails?: A Little Help needed moving to and from a bed to a chair (including a wheelchair)?: A Little Help needed standing up from a chair using your arms (e.g., wheelchair or bedside chair)?: A Little Help needed to walk in hospital room?: A Little Help needed climbing 3-5 steps with a railing? : A Lot 6 Click Score: 17    End of Session   Activity  Tolerance: Other (comment) (N/V) Patient left: in chair;with call bell/phone within reach;with family/visitor present Nurse Communication: Mobility status PT Visit Diagnosis: Pain;Difficulty in walking, not elsewhere classified (R26.2)     Time: 1210-1232 PT Time Calculation (min) (ACUTE ONLY): 22 min  Charges:    $Therapeutic Exercise: 8-22 mins PT General Charges $$ ACUTE PT VISIT: 1 Visit                     Lenoard SQUIBB, PT Acute Rehabilitation Services Office: [phone]    Lenoard NOVAK Kodee Drury 01/03/2025, 1:51 PM

## 2025-01-03 NOTE — Plan of Care (Signed)
  Problem: Clinical Measurements: Goal: Respiratory complications will improve Outcome: Progressing   Problem: Clinical Measurements: Goal: Cardiovascular complication will be avoided Outcome: Progressing   Problem: Pain Managment: Goal: General experience of comfort will improve and/or be controlled Outcome: Progressing   Problem: Safety: Goal: Ability to remain free from injury will improve Outcome: Progressing

## 2025-01-03 NOTE — Progress Notes (Signed)
 PT Cancellation Note  Patient Details Name: Natalie Sanders MRN: [id number] DOB: [birth year]   Cancelled Treatment:    Reason Eval/Treat Not Completed: Other (comment) (pt with N/V and declined at this time)   Fayrene Towner B Janele Lague 01/03/2025, 11:30 AM Lenoard SQUIBB, PT Acute Rehabilitation Services Office: [phone]

## 2025-01-04 ENCOUNTER — Other Ambulatory Visit (HOSPITAL_COMMUNITY): Payer: Self-pay

## 2025-01-04 NOTE — Progress Notes (Signed)
 Physical Therapy Treatment Patient Details Name: Natalie Sanders MRN: [id number] DOB: 02-Sep-1951 Today's Date: 01/04/2025   History of Present Illness Pt is a 74 y.o. F who presents with left femoral neck fx s/p THA. CT chest on 1/24 with finding of PE. PMH includes R TKA, R THA, herniated cervical disc.    PT Comments  Pt reports improvement in nausea/vomiting since having a bowel movement yesterday. Pt overall is mobilizing well and eager to go home. Pt ambulating 80 ft with a RW with step to gait pattern. SpO2 97% on 2L O2. Focus on stair training, with pt negotiating 3 steps with bilateral rails to simulate home set up. Pt with no further questions/concerns. D/c with HHPT remains appropriate.     If plan is discharge home, recommend the following: A little help with walking and/or transfers;A little help with bathing/dressing/bathroom;Assistance with cooking/housework;Assist for transportation;Help with stairs or ramp for entrance   Can travel by private vehicle        Equipment Recommendations  None recommended by PT    Recommendations for Other Services       Precautions / Restrictions Precautions Precautions: Fall Recall of Precautions/Restrictions: Intact Restrictions Weight Bearing Restrictions Per Provider Order: Yes LLE Weight Bearing Per Provider Order: Weight bearing as tolerated     Mobility  Bed Mobility Overal bed mobility: Needs Assistance Bed Mobility: Supine to Sit     Supine to sit: Mod assist     General bed mobility comments: Assist for LLE management, use of bed pad to pivot hips out to edge of bed. Pt reports difficulty mobilizing due to sheets - states she will sleep in a recliner at home    Transfers Overall transfer level: Modified independent Equipment used: Rolling walker (2 wheels)                    Ambulation/Gait Ambulation/Gait assistance: Modified independent (Device/Increase time) Gait Distance (Feet): 80 Feet Assistive  device: Rolling walker (2 wheels) Gait Pattern/deviations: Step-to pattern, Decreased stance time - left, Decreased dorsiflexion - left Gait velocity: decreased     General Gait Details: Pt self cueing for gait pattern, moderate reliance through arms on RW   Stairs Stairs: Yes Stairs assistance: Modified independent (Device/Increase time) Stair Management: Two rails Number of Stairs: 3 General stair comments: Verbal cues for sequencing/technique   Wheelchair Mobility     Tilt Bed    Modified Rankin (Stroke Patients Only)       Balance Overall balance assessment: Needs assistance Sitting-balance support: Feet supported, No upper extremity supported Sitting balance-Leahy Scale: Good     Standing balance support: Bilateral upper extremity supported, During functional activity, Reliant on assistive device for balance Standing balance-Leahy Scale: Poor Standing balance comment: Dependent on RW                            Communication Communication Communication: No apparent difficulties  Cognition Arousal: Alert Behavior During Therapy: WFL for tasks assessed/performed   PT - Cognitive impairments: No apparent impairments                         Following commands: Intact      Cueing Cueing Techniques: Verbal cues  Exercises      General Comments        Pertinent Vitals/Pain Pain Assessment Pain Assessment: 0-10 Pain Score: 6  Pain Location: L knee and hip Pain Descriptors /  Indicators: Discomfort, Sore, Tender, Aching Pain Intervention(s): Monitored during session, Patient requesting pain meds-RN notified    Home Living                          Prior Function            PT Goals (current goals can now be found in the care plan section) Acute Rehab PT Goals Potential to Achieve Goals: Good Progress towards PT goals: Progressing toward goals    Frequency    Min 3X/week      PT Plan      Co-evaluation               AM-PAC PT 6 Clicks Mobility   Outcome Measure  Help needed turning from your back to your side while in a flat bed without using bedrails?: A Little Help needed moving from lying on your back to sitting on the side of a flat bed without using bedrails?: A Lot Help needed moving to and from a bed to a chair (including a wheelchair)?: None Help needed standing up from a chair using your arms (e.g., wheelchair or bedside chair)?: None Help needed to walk in hospital room?: None Help needed climbing 3-5 steps with a railing? : None 6 Click Score: 21    End of Session Equipment Utilized During Treatment: Oxygen Activity Tolerance: Patient tolerated treatment well Patient left: in chair;with call bell/phone within reach Nurse Communication: Mobility status PT Visit Diagnosis: Pain;Difficulty in walking, not elsewhere classified (R26.2) Pain - Right/Left: Left Pain - part of body: Hip     Time: 0815-0852 PT Time Calculation (min) (ACUTE ONLY): 37 min  Charges:    $Gait Training: 8-22 mins $Therapeutic Activity: 8-22 mins PT General Charges $$ ACUTE PT VISIT: 1 Visit                     Aleck Daring, PT, DPT Acute Rehabilitation Services Office [phone]    Aleck ONEIDA Daring 01/04/2025, 9:21 AM

## 2025-01-04 NOTE — Progress Notes (Signed)
 Patient seen and examined at bedside today.  She feels much comfortable this morning.  She was already seen by PT this morning.  She could not be discharged yesterday because she had a vomiting and did not have a bowel movement.  Had a large bowel movement yesterday.  She feels ready to go home today.  No change in medical management.  Discharge summary and orders already in place.  Medically stable for discharge.

## 2025-01-04 NOTE — Care Management Important Message (Signed)
 Important Message  Patient Details  Name: Natalie Sanders MRN: [id number] Date of Birth: May 28, 1951   Important Message Given:  Yes - Medicare IM   Patient left prior to IM delivery will mail a copy to the patient home address.   Meriah Shands 01/04/2025, 3:03 PM

## 2025-01-10 ENCOUNTER — Other Ambulatory Visit: Payer: Self-pay | Admitting: Physician Assistant

## 2025-01-10 ENCOUNTER — Encounter: Payer: Self-pay | Admitting: Orthopaedic Surgery

## 2025-01-10 MED ORDER — OXYCODONE HCL 5 MG PO TABS: 5.0000 mg | ORAL_TABLET | Freq: Two times a day (BID) | ORAL | 0 refills | Status: AC | PRN

## 2025-01-19 ENCOUNTER — Encounter: Admitting: Orthopaedic Surgery

## 2025-01-27 ENCOUNTER — Ambulatory Visit: Admitting: Internal Medicine

## 2025-03-13 ENCOUNTER — Ambulatory Visit: Admitting: Orthopaedic Surgery
